# Patient Record
Sex: Female | Born: 1945 | Race: White | Hispanic: No | Marital: Married | Smoking: Former smoker
Health system: Southern US, Community
[De-identification: ages and names within clinical notes are randomized; demographics above are authoritative.]

## PROBLEM LIST (undated history)

## (undated) DIAGNOSIS — I10 Essential (primary) hypertension: Secondary | ICD-10-CM

## (undated) DIAGNOSIS — M1990 Unspecified osteoarthritis, unspecified site: Secondary | ICD-10-CM

## (undated) DIAGNOSIS — F419 Anxiety disorder, unspecified: Secondary | ICD-10-CM

## (undated) DIAGNOSIS — Z87448 Personal history of other diseases of urinary system: Secondary | ICD-10-CM

## (undated) DIAGNOSIS — M109 Gout, unspecified: Secondary | ICD-10-CM

## (undated) DIAGNOSIS — T8859XA Other complications of anesthesia, initial encounter: Secondary | ICD-10-CM

## (undated) DIAGNOSIS — T4145XA Adverse effect of unspecified anesthetic, initial encounter: Secondary | ICD-10-CM

## (undated) DIAGNOSIS — E785 Hyperlipidemia, unspecified: Secondary | ICD-10-CM

## (undated) DIAGNOSIS — E669 Obesity, unspecified: Secondary | ICD-10-CM

## (undated) HISTORY — DX: Gout, unspecified: M10.9

## (undated) HISTORY — DX: Obesity, unspecified: E66.9

---

## 1988-01-18 HISTORY — PX: ABDOMINAL HYSTERECTOMY: SHX81

## 2007-09-19 ENCOUNTER — Ambulatory Visit: Payer: Self-pay | Admitting: Specialist

## 2008-01-18 HISTORY — PX: CHOLECYSTECTOMY: SHX55

## 2008-01-31 ENCOUNTER — Emergency Department: Payer: Self-pay | Admitting: Emergency Medicine

## 2009-01-19 ENCOUNTER — Emergency Department: Payer: Self-pay | Admitting: Internal Medicine

## 2010-04-15 ENCOUNTER — Other Ambulatory Visit: Payer: Self-pay | Admitting: Orthopedic Surgery

## 2010-04-15 ENCOUNTER — Encounter (HOSPITAL_COMMUNITY): Payer: Medicare Other

## 2010-04-15 ENCOUNTER — Other Ambulatory Visit (HOSPITAL_COMMUNITY): Payer: Self-pay | Admitting: Orthopedic Surgery

## 2010-04-15 ENCOUNTER — Ambulatory Visit (HOSPITAL_COMMUNITY)
Admission: RE | Admit: 2010-04-15 | Discharge: 2010-04-15 | Disposition: A | Discharge status: Home / Self Care | Payer: Medicare Other | Source: Ambulatory Visit | Attending: Orthopedic Surgery | Admitting: Orthopedic Surgery

## 2010-04-15 DIAGNOSIS — Z01811 Encounter for preprocedural respiratory examination: Secondary | ICD-10-CM | POA: Insufficient documentation

## 2010-04-15 DIAGNOSIS — Z01818 Encounter for other preprocedural examination: Secondary | ICD-10-CM | POA: Insufficient documentation

## 2010-04-15 DIAGNOSIS — Z01812 Encounter for preprocedural laboratory examination: Secondary | ICD-10-CM | POA: Insufficient documentation

## 2010-04-15 LAB — URINALYSIS, ROUTINE W REFLEX MICROSCOPIC
Nitrite: NEGATIVE
Specific Gravity, Urine: 1.019 (ref 1.005–1.030)
Urobilinogen, UA: 0.2 mg/dL (ref 0.0–1.0)

## 2010-04-15 LAB — CBC
HCT: 31.6 % — ABNORMAL LOW (ref 36.0–46.0)
Hemoglobin: 10 g/dL — ABNORMAL LOW (ref 12.0–15.0)
MCV: 97.5 fL (ref 78.0–100.0)
WBC: 7.3 10*3/uL (ref 4.0–10.5)

## 2010-04-15 LAB — COMPREHENSIVE METABOLIC PANEL
ALT: 15 U/L (ref 0–35)
AST: 14 U/L (ref 0–37)
Albumin: 3.6 g/dL (ref 3.5–5.2)
Calcium: 9.2 mg/dL (ref 8.4–10.5)
GFR calc Af Amer: 38 mL/min — ABNORMAL LOW (ref >60)
Glucose, Bld: 96 mg/dL (ref 70–99)
Sodium: 139 mEq/L (ref 135–145)
Total Protein: 7.4 g/dL (ref 6.0–8.3)

## 2010-04-15 LAB — PROTIME-INR: INR: 1.01 (ref 0.00–1.49)

## 2010-04-15 LAB — APTT: aPTT: 26 seconds (ref 24–37)

## 2010-04-15 LAB — SURGICAL PCR SCREEN: Staphylococcus aureus: POSITIVE — AB

## 2010-04-26 ENCOUNTER — Inpatient Hospital Stay (HOSPITAL_COMMUNITY)
Admission: RE | Admit: 2010-04-26 | Discharge: 2010-04-29 | DRG: 470 | Disposition: A | Discharge status: Skilled Nursing Facility | Payer: Medicare Other | Source: Ambulatory Visit | Attending: Orthopedic Surgery | Admitting: Orthopedic Surgery

## 2010-04-26 DIAGNOSIS — D62 Acute posthemorrhagic anemia: Secondary | ICD-10-CM | POA: Diagnosis not present

## 2010-04-26 DIAGNOSIS — E8779 Other fluid overload: Secondary | ICD-10-CM | POA: Diagnosis not present

## 2010-04-26 DIAGNOSIS — R4182 Altered mental status, unspecified: Secondary | ICD-10-CM | POA: Diagnosis not present

## 2010-04-26 DIAGNOSIS — M1710 Unilateral primary osteoarthritis, unspecified knee: Principal | ICD-10-CM | POA: Diagnosis present

## 2010-04-26 DIAGNOSIS — E7800 Pure hypercholesterolemia, unspecified: Secondary | ICD-10-CM | POA: Diagnosis present

## 2010-04-26 DIAGNOSIS — I10 Essential (primary) hypertension: Secondary | ICD-10-CM | POA: Diagnosis present

## 2010-04-26 DIAGNOSIS — N289 Disorder of kidney and ureter, unspecified: Secondary | ICD-10-CM | POA: Diagnosis present

## 2010-04-27 LAB — CBC
Hemoglobin: 8.7 g/dL — ABNORMAL LOW (ref 12.0–15.0)
MCH: 31.6 pg (ref 26.0–34.0)
MCV: 99.6 fL (ref 78.0–100.0)
RBC: 2.75 MIL/uL — ABNORMAL LOW (ref 3.87–5.11)

## 2010-04-27 LAB — BASIC METABOLIC PANEL
CO2: 26 mEq/L (ref 19–32)
Chloride: 107 mEq/L (ref 96–112)
Creatinine, Ser: 1.43 mg/dL — ABNORMAL HIGH (ref 0.4–1.2)
GFR calc Af Amer: 44 mL/min — ABNORMAL LOW (ref >60)

## 2010-04-28 LAB — CBC
HCT: 24.2 % — ABNORMAL LOW (ref 36.0–46.0)
MCH: 31.6 pg (ref 26.0–34.0)
MCHC: 33.1 g/dL (ref 30.0–36.0)
MCV: 95.7 fL (ref 78.0–100.0)
RDW: 13.6 % (ref 11.5–15.5)

## 2010-04-28 LAB — BASIC METABOLIC PANEL
BUN: 21 mg/dL (ref 6–23)
CO2: 27 mEq/L (ref 19–32)
Chloride: 103 mEq/L (ref 96–112)
Creatinine, Ser: 1.5 mg/dL — ABNORMAL HIGH (ref 0.4–1.2)

## 2010-04-28 NOTE — H&P (Signed)
Carolyn Swanson, Carolyn Swanson                ACCOUNT NO.:  [id number]  MEDICAL RECORD NO.:  [id number]           PATIENT TYPE:  O  LOCATION:  XRAY                         FACILITY:  [facility]  PHYSICIAN:  Rozell Searing, [facility]    DATE OF BIRTH:  [birth year]  DATE OF ADMISSION:  04/15/2010 DATE OF DISCHARGE:  04/15/2010                             HISTORY & PHYSICAL   CHIEF COMPLAINT:  Left knee pain.  BRIEF HISTORY:  Ms. Aguado was initially seen by Dr. Lequita Halt back in September 2011 regarding her left knee.  She had cortisone injection at that time that helped for a short period of time.  Unfortunately the knee pain has come back and is worse than it was previously.  She is having worsening dysfunction.  She states that it is certainly limiting what she is able to do.  She is getting pain at nighttime as well as the daytime.  She now presents for left total knee arthroplasty.  Ms. Gutterman has been cleared by her primary care physician, Dr. Shayne Alken, with the only request that she continue her blood pressure medications.  ALLERGIES:  She has no medication allergies.  CURRENT MEDICATIONS INCLUDE: 1. Lisinopril/hydrochlorothiazide. 2. Folic acid. 3. Aspirin. 4. Carvedilol. 5. Lipitor. 6. Effexor. 7. She will discontinue the folic acid and aspirin 5 days prior to     surgery.  PAST MEDICAL HISTORY:  Includes end-stage arthritis of the left knee, history of shingles 40+ years ago, hypertension, hyperlipidemia, gallbladder disease.  PAST SURGICAL HISTORY:  Cholecystectomy and hysterectomy.  FAMILY HISTORY:  Father passed at age of 47, he had multiple health problems including lung cancer.  Mother passed at the age of 38 of breast cancer.  SOCIAL HISTORY:  The patient is married.  She is retired.  She denies past or present use of alcohol or tobacco products.  She has two children.  She plans to go to [employer] which is within [employer]	[place] over in [place].  She  has already spoken with their facility.  REVIEW OF SYSTEMS:  GENERAL:  Negative for fevers, chills or weight change.  HEENT:  Negative for headache, blurred vision or double vision. NEUROLOGIC:  Negative for rash or lesion.  RESPIRATORY:  Negative shortness of breath at rest or on exertion.  Last chest x-ray was a year ago.  CARDIOVASCULAR:  Negative for chest pain or palpitations.  Last EKG was about a year ago.  GI: Negative for nausea, vomiting or diarrhea.  GU: Negative for hematuria, dysuria.  MUSCULOSKELETAL: Positive for joint pain and swelling.  PHYSICAL EXAMINATION:  VITAL SIGNS:  Pulse 80, respirations 18, blood pressure 146/82 in the left arm.  GENERAL:  Ms. Suhre is alert and oriented x3.  She is well-developed, well-nourished in no apparent distress.  She is her stated height of 5 feet 7 inches and her stated weight of 203 pounds.  She is accompanied today by her friend.  HEENT: Normocephalic, atraumatic.  Extraocular movements intact.  NECK: Supple.  Full range of motion without lymphadenopathy.  CHEST:  Lungs are clear to auscultation bilaterally without wheezes, rhonchi or rales. HEART:  Regular rate and rhythm without murmur.  S1, S2 is appreciated. ABDOMEN: Bowel sounds present in all four quadrants.  Abdomen is soft, nontender to palpation.  EXTREMITIES:  Evaluation of the left knee negative for effusion.  Range is 5 to 120 degrees.  There is marked crepitus throughout the range of motion, tenderness with palpation over the medial greater than lateral joint line.  No instability is noted. NEUROLOGIC:  Sensation is grossly intact in the lower extremities bilaterally.  SKIN:  Unremarkable.  Radiographs AP and lateral views of the left knee reveal bone-on-bone arthritis of the medial and patellofemoral compartments.  IMPRESSION:  End-stage arthritis of the left knee.  PLAN:  Left total knee arthroplasty to be performed by Dr. Lequita Halt.  Dictated For:  Gus Rankin.  Aluisio, MD.     Rozell Searing, [facility]     LD/MEDQ  D:  04/26/2010  T:  04/26/2010  Job:  [id number]  Electronically Signed by Rozell Searing  on 04/28/2010 08:18:56 AM

## 2010-04-29 ENCOUNTER — Encounter: Payer: Self-pay | Admitting: Internal Medicine

## 2010-04-29 LAB — BASIC METABOLIC PANEL
CO2: 27 mEq/L (ref 19–32)
Calcium: 8.7 mg/dL (ref 8.4–10.5)
Chloride: 104 mEq/L (ref 96–112)
Creatinine, Ser: 1.3 mg/dL — ABNORMAL HIGH (ref 0.4–1.2)
Glucose, Bld: 143 mg/dL — ABNORMAL HIGH (ref 70–99)
Sodium: 137 mEq/L (ref 135–145)

## 2010-04-29 LAB — CBC
MCH: 31.6 pg (ref 26.0–34.0)
MCV: 93.9 fL (ref 78.0–100.0)
Platelets: 200 10*3/uL (ref 150–400)
RDW: 13.5 % (ref 11.5–15.5)

## 2010-04-30 LAB — CROSSMATCH
ABO/RH(D): O POS
Antibody Screen: NEGATIVE
Unit division: 0

## 2010-04-30 NOTE — Op Note (Signed)
Carolyn Swanson, Carolyn Swanson                ACCOUNT NO.:  [id number]  MEDICAL RECORD NO.:  [id number]           PATIENT TYPE:  I  LOCATION:  0004                         FACILITY:  [facility]  PHYSICIAN:  Ollen Gross, M.D.    DATE OF BIRTH:  Aug 06, 1945  DATE OF PROCEDURE: DATE OF DISCHARGE:                              OPERATIVE REPORT   PREOPERATIVE DIAGNOSIS:  Osteoarthritis of the left knee.  POSTOPERATIVE DIAGNOSIS:  Osteoarthritis of the left knee.  PROCEDURE:  Left total-knee arthroplasty.  SURGEON:  Ollen Gross, M.D.  ASSISTANT:  Alexzandrew L. Perkins, P.A.C.  ANESTHESIA:  Spinal.  ESTIMATED BLOOD LOSS:  Minimal.  DRAINS:  Hemovac x1.  TOURNIQUET TIME:  33 minutes at 300 mmHg.  COMPLICATIONS:  None.  CONDITION:  Stable to recovery.  BRIEF CLINICAL NOTE:  [name] is a 65 year old female who has advanced end-stage arthritis of the left worse than right knee with progressively worsening pain and dysfunction.  She has failed nonoperative management and presents now for a left total-knee arthroplasty.  PROCEDURE IN DETAIL:  After successful administration of spinal anesthetic, a tourniquet was placed high on her left thigh and her left lower extremity was prepped and draped in the usual sterile fashion. Extremities wrapped in Esmarch, knee flexed, tourniquet inflated to 300 mmHg.  Midline incision was made with a 10 blade through the subcutaneous tissue to the level of the extensor mechanism.  A fresh blade was used to make a medial parapatellar arthrotomy.  Soft tissue on the proximal medial tibia was subperiosteally elevated to the joint line with the knife and into the semimembranosus bursa with the Cobb elevator.  Soft tissue laterally was elevated with attention being paid to avoiding the patellar tendon on the tibial tubercle.  The patella was everted, knee flexed 90 degrees and ACL and PCL were removed.  The drill was used to create a starting hole in the  distal femur and the canal was thoroughly irrigated.  The 5-degrees left valgus alignment guide was placed and distal femoral cutting block was pinned to remove 11 mm off the distal femur.  Distal femoral resection was made with an oscillating saw.  The tibia was subluxed forward and the menisci were removed. Extramedullary tibial alignment guide was placed referencing proximally at the medial aspect of the tibial tubercle and distally along the second metatarsal axis of the tibial crest.  Block was pinned to remove 2 mm off the more deficient medial side.  Tibial resection was made with an oscillating saw.  Size 2.5 was the most appropriate tibial component and the proximal tibia was prepared with the modular drill and keel punch for the size 2.5.  Femoral sizing guide was placed, size 2.5 was the most appropriate. Rotation was marked off the epicondylar axis and confirmed by creating a rectangular flexion gap at 90 degrees.  Cutting block was pinned in this rotation and the anterior, posterior and chamfer cuts were made.  The intercondylar block was placed and that cut was made.  Trial size 2.5 posterior stabilized femur was placed.  The 10-mm posterior stabilized rotating platform insert trial was placed and  there was a tiny bit of hyperextension with the 12.5, which allowed for full extension with excellent varus-valgus and anterior-posterior balance throughout full range of motion.  Patella was everted and thickness measured to be 22 mm.  Freehand resection was taken to 13 mm, 35 template was placed, lug holes were drilled, trial patella was placed and it tracks normally. Osteophytes were removed off the posterior femur with the trial in place.  All trials were removed and the cut bone surfaces were prepared with pulsatile lavage.  Cement was mixed and once ready for implantation, the size 2.5 mobile bearing tibial tray, 2.5 posterior stabilized femur and 35 patella were cemented  into place and the patella was held with a clamp.  Trial 12.5-mm insert was placed, knee held in full extension and all extruded cement was removed.  When the cement was fully hardened, then the permanent 12.5-mm posterior stabilized rotating platform insert was placed into the tibial tray.  The wound was copiously irrigated with saline solution and the arthrotomy closed over a Hemovac drain with interrupted #1 PDS.  Flexion against gravity was 135 degrees and the patella tracks normally.  Tourniquet was released for a total time of 33 minutes.  Subcutaneous was closed with interrupted 2-0 Vicryl and subcuticular running 4-0 Monocryl.  Incision was cleaned and dried and Steri-Strips and a bulky sterile dressing were applied.  She was then placed into a knee immobilizer, awakened and transported to recovery in stable condition.     Ollen Gross, M.D.     FA/MEDQ  D:  04/26/2010  T:  04/26/2010  Job:  [id number]  Electronically Signed by Ollen Gross M.D. on 04/30/2010 09:01:53 AM

## 2010-04-30 NOTE — Discharge Summary (Signed)
Carolyn Swanson, Carolyn Swanson                ACCOUNT NO.:  [id number]  MEDICAL RECORD NO.:  [id number]           PATIENT TYPE:  I  LOCATION:  1617                         FACILITY:  [facility]  PHYSICIAN:  Ollen Gross, M.D.    DATE OF BIRTH:  02/28/1945  DATE OF ADMISSION:  04/26/2010 DATE OF DISCHARGE:                        DISCHARGE SUMMARY - REFERRING   TENTATIVE DATE OF DISCHARGE:  April 29, 2010.  ADMITTING DIAGNOSES: 1. Osteoarthritis, left knee. 2. History of shingles. 3. Hypertension. 4. Hyperlipidemia. 5. Mild renal insufficiency noted on preoperative labs.  DISCHARGE DIAGNOSES: 1. Osteoarthritis, left knee, status post left total knee replacement     arthroplasty. 2. Postop acute blood loss anemia. 3. Postoperative altered mental status, improved. 4. Mild renal insufficiency noted on preoperative labs.  PROCEDURE:  April 26, 2010, left total knee.  SURGEON:  Ollen Gross, M.D.  ASSISTANT:  Rozell Searing, [facility] ANESTHESIA:  Spinal.  TOURNIQUET TIME:  33 minutes.  CONSULTS:  None.  BRIEF HISTORY:  Carolyn Swanson is a 65 year old female, seen by Dr. Lequita Halt for ongoing knee pain, which has been progressively getting worse.  It was felt that she is willing to undergo surgical intervention, subsequently admitted for knee replacement.  LABORATORY DATA:  Preop CBC showed a low hemoglobin of 10, hematocrit of 31.6 with preexisting anemia.  Preop white count 7.3, platelets 278,000. PT/INR 13.5 and 1.01 with PTT of 26.  Chem panel on admission showed some mild renal insufficiency with creatinine of 1.62 and BUN of 43. Alkaline phosphatase was elevated at 139.  Remaining Chem panel all within normal limits.  Preop UA negative.  Blood group type O positive. Nasal swabs were positive for Staph aureus but negative for MRSA. Serial CBCs were followed.  Hemoglobin dropped down from 8.7 to 8 last night.  H and H was 7.7 and 22.9.  She was asymptomatic at this point. Would recheck the  hemoglobin at facility.  Serial prothrombin time is followed per Coumadin protocol.  PT/INR with a prothrombin time of 26.5 and the INR was therapeutic at 2.43.  Serial BMETs were followed for 3 days.  Electrolytes all remained within normal limits.  BUN came down from preop level of 43 down to 19, may be associated with mild dehydration due to n.p.o. status.  Creatinine was slightly elevated preop at 1.62, came down to 1.43, last noted improved level of 1.30.  X-rays, 2-view chest, April 15, 2010 negative.  EKG April 15, 2010, normal sinus rhythm, low-voltage QRS, no old tracing compared, nonspecific T-wave abnormality, confirmed by Dr. Sharyn Lull.  HOSPITAL COURSE:  The patient was admitted to [facility], taken to OR, underwent the above-stated procedure, tolerated procedure well, later transferred to recovery room on orthopedic floor.  Had a rough night following the surgery with pain, requiring p.o. and IV analgesics.  The patient did want to look into [facility] up in [place], so we got Social Work involved right after surgery, had decent urinary output.  Blood pressure meds were restarted on parameters.  She was moderately volume overloaded on first days, so we gave her a little bit of Lasix to help  pull out the spacing fluid. She had excellent urinary output after that.  She got up and did a little pivoting and had unfortunately some confusion, disorientation later that afternoon which was through the night and we switched her medications around.  We took her off the heavy narcotics, put her on mild pain medication and by the postoperative day #2 was actually improved and noted on postop day #2 also and the narcotics were adjusted.  She did improve throughout the afternoon of day #2 and then day #3.  Her creatinine was noted be a little elevated on postop day #2 to 1.5 but it did come back down on postop day #3 and was back down to 1.3.  She was slow to  progress.  She was seen on rounds on morning of day #3.  The altered mental status postop was transient, improved.  The family had been in touch with [facility] in [place] preoperatively and the family was under the impression that the bed would be available today.  We have not heard from the Social Work at the time of dictation but the patient was stable.  Even though her hemoglobin was low, she was asymptomatic with this.  We would set up and arrange for her to go today if the bed is confirmed to be available.  DISCHARGE PLAN: 1. Tentative transfer today on April 29, 2010. 2. Discharge diagnoses, please see above. 3. Discharge medications, current medications at time of dictation.     Coumadin protocol, please titrate the Coumadin level for target INR     between 2.0 and 3.0.  She needs to be on Coumadin for 3 weeks from     the date of surgery.  Effexor XR 75 p.o. q.h.s., aspirin 81 mg p.o.     daily, Coreg 12.5 mg twice a day, Lipitor 40 mg p.o. q.h.s.,     lisinopril/HCTZ 10/12.5 mg p.o. q.a.m., Colace 100 mg p.o. b.i.d.,     Nu-Iron 150 mg p.o. b.i.d. for 3 weeks, then discontinue the Nu-    Iron, Tylenol 325 mg 1 or 2 every 4 to 6 hours as needed for mild     pain, temperature or headache, laxative of choice, enema of choice,     Robaxin 500 mg p.o. q.6-8h. p.r.n. spasm, Ultracet 1 or 2 tablets     every 6 hours as needed for moderate pain.  DIET:  Heart-healthy diet.  ACTIVITY:  She is weightbearing as tolerated.  Total knee protocol, left lower extremity.  PT and OT, begin training, ambulation, ADLs.  Please note the patient may start showering once she is transferred over, however, do not submerge incision underwater, daily dressing changes associated with the showering.  FOLLOWUP:  She needs to follow up with Dr. Lequita Halt in the office in approximately 2 to 2-1/2 weeks following her date of surgery.  Please contact the office at [phone] to help arrange  appointment, transportation, and followup care for an appointment in about 2 to 2-1/2 weeks with Dr. Lequita Halt.  DISPOSITION:  The patient is trying to arrange transportation to [facility] in [place].  CONDITION ON DISCHARGE:  Pending notification of bed but at the time of dictation, the patient is improving.     Alexzandrew L. Julien Girt, P.A.C.   ______________________________ Ollen Gross, M.D.    ALP/MEDQ  D:  04/29/2010  T:  04/29/2010  Job:  [id number]  cc:   Dr. Randa Lynn  Electronically Signed by Marlowe Sax PERKINS P.A.C. on 04/29/2010 11:12:48 AM  Electronically Signed by Ollen Gross M.D. on 04/30/2010 09:01:55 AM

## 2010-05-17 ENCOUNTER — Encounter: Payer: Self-pay | Admitting: Orthopedic Surgery

## 2010-05-18 ENCOUNTER — Encounter: Payer: Self-pay | Admitting: Orthopedic Surgery

## 2010-05-18 ENCOUNTER — Encounter: Payer: Self-pay | Admitting: Internal Medicine

## 2010-06-18 ENCOUNTER — Encounter: Payer: Self-pay | Admitting: Internal Medicine

## 2010-06-18 ENCOUNTER — Encounter: Payer: Self-pay | Admitting: Orthopedic Surgery

## 2010-07-18 ENCOUNTER — Encounter: Payer: Self-pay | Admitting: Orthopedic Surgery

## 2010-07-18 ENCOUNTER — Encounter: Payer: Self-pay | Admitting: Internal Medicine

## 2010-08-18 ENCOUNTER — Encounter: Payer: Self-pay | Admitting: Internal Medicine

## 2011-01-18 HISTORY — PX: JOINT REPLACEMENT: SHX530

## 2012-11-12 ENCOUNTER — Ambulatory Visit: Payer: Self-pay | Admitting: Adult Health

## 2013-01-14 ENCOUNTER — Other Ambulatory Visit: Payer: Self-pay | Admitting: Orthopedic Surgery

## 2013-01-17 DIAGNOSIS — M109 Gout, unspecified: Secondary | ICD-10-CM

## 2013-01-17 HISTORY — DX: Gout, unspecified: M10.9

## 2013-01-23 ENCOUNTER — Encounter (HOSPITAL_COMMUNITY): Payer: Self-pay | Admitting: Pharmacy Technician

## 2013-01-25 ENCOUNTER — Other Ambulatory Visit (HOSPITAL_COMMUNITY): Payer: Self-pay | Admitting: *Deleted

## 2013-01-29 ENCOUNTER — Ambulatory Visit (HOSPITAL_COMMUNITY)
Admission: RE | Admit: 2013-01-29 | Discharge: 2013-01-29 | Disposition: A | Discharge status: Home / Self Care | Payer: Medicare Other | Source: Ambulatory Visit | Attending: Orthopedic Surgery | Admitting: Orthopedic Surgery

## 2013-01-29 ENCOUNTER — Encounter (HOSPITAL_COMMUNITY)
Admission: RE | Admit: 2013-01-29 | Discharge: 2013-01-29 | Disposition: A | Discharge status: Home / Self Care | Payer: Medicare Other | Source: Ambulatory Visit | Attending: Orthopedic Surgery | Admitting: Orthopedic Surgery

## 2013-01-29 ENCOUNTER — Encounter (HOSPITAL_COMMUNITY): Payer: Self-pay

## 2013-01-29 DIAGNOSIS — Z01818 Encounter for other preprocedural examination: Secondary | ICD-10-CM | POA: Insufficient documentation

## 2013-01-29 DIAGNOSIS — Z01812 Encounter for preprocedural laboratory examination: Secondary | ICD-10-CM | POA: Insufficient documentation

## 2013-01-29 DIAGNOSIS — Z0183 Encounter for blood typing: Secondary | ICD-10-CM | POA: Insufficient documentation

## 2013-01-29 DIAGNOSIS — M1710 Unilateral primary osteoarthritis, unspecified knee: Secondary | ICD-10-CM | POA: Insufficient documentation

## 2013-01-29 HISTORY — DX: Essential (primary) hypertension: I10

## 2013-01-29 HISTORY — DX: Adverse effect of unspecified anesthetic, initial encounter: T41.45XA

## 2013-01-29 HISTORY — DX: Anxiety disorder, unspecified: F41.9

## 2013-01-29 HISTORY — DX: Other complications of anesthesia, initial encounter: T88.59XA

## 2013-01-29 HISTORY — DX: Personal history of other diseases of urinary system: Z87.448

## 2013-01-29 HISTORY — DX: Hyperlipidemia, unspecified: E78.5

## 2013-01-29 HISTORY — DX: Unspecified osteoarthritis, unspecified site: M19.90

## 2013-01-29 LAB — COMPREHENSIVE METABOLIC PANEL
ALBUMIN: 3.6 g/dL (ref 3.5–5.2)
ALK PHOS: 181 U/L — AB (ref 39–117)
ALT: 16 U/L (ref 0–35)
AST: 17 U/L (ref 0–37)
BUN: 22 mg/dL (ref 6–23)
CALCIUM: 9.4 mg/dL (ref 8.4–10.5)
CO2: 24 mEq/L (ref 19–32)
CREATININE: 1.38 mg/dL — AB (ref 0.50–1.10)
Chloride: 103 mEq/L (ref 96–112)
GFR calc non Af Amer: 38 mL/min — ABNORMAL LOW (ref >90)
GFR, EST AFRICAN AMERICAN: 44 mL/min — AB (ref >90)
GLUCOSE: 141 mg/dL — AB (ref 70–99)
POTASSIUM: 5.2 meq/L (ref 3.7–5.3)
Sodium: 139 mEq/L (ref 137–147)
TOTAL PROTEIN: 7.8 g/dL (ref 6.0–8.3)
Total Bilirubin: 0.4 mg/dL (ref 0.3–1.2)

## 2013-01-29 LAB — SURGICAL PCR SCREEN
MRSA, PCR: NEGATIVE
STAPHYLOCOCCUS AUREUS: POSITIVE — AB

## 2013-01-29 LAB — CBC
HEMATOCRIT: 34.3 % — AB (ref 36.0–46.0)
HEMOGLOBIN: 11.2 g/dL — AB (ref 12.0–15.0)
MCH: 31 pg (ref 26.0–34.0)
MCHC: 32.7 g/dL (ref 30.0–36.0)
MCV: 95 fL (ref 78.0–100.0)
Platelets: 285 10*3/uL (ref 150–400)
RBC: 3.61 MIL/uL — ABNORMAL LOW (ref 3.87–5.11)
RDW: 13.4 % (ref 11.5–15.5)
WBC: 7.6 10*3/uL (ref 4.0–10.5)

## 2013-01-29 LAB — URINALYSIS, ROUTINE W REFLEX MICROSCOPIC
BILIRUBIN URINE: NEGATIVE
Glucose, UA: NEGATIVE mg/dL
HGB URINE DIPSTICK: NEGATIVE
KETONES UR: NEGATIVE mg/dL
Leukocytes, UA: NEGATIVE
NITRITE: NEGATIVE
Protein, ur: NEGATIVE mg/dL
Specific Gravity, Urine: 1.017 (ref 1.005–1.030)
UROBILINOGEN UA: 0.2 mg/dL (ref 0.0–1.0)
pH: 5.5 (ref 5.0–8.0)

## 2013-01-29 LAB — PROTIME-INR
INR: 1.02 (ref 0.00–1.49)
PROTHROMBIN TIME: 13.2 s (ref 11.6–15.2)

## 2013-01-29 LAB — APTT: APTT: 25 s (ref 24–37)

## 2013-01-29 NOTE — Progress Notes (Signed)
Abnormal CMET faxed to Dr. Wynelle Link

## 2013-01-29 NOTE — Progress Notes (Signed)
01/29/13 1200  OBSTRUCTIVE SLEEP APNEA  Have you ever been diagnosed with sleep apnea through a sleep study? No  Do you snore loudly (loud enough to be heard through closed doors)?  1  Do you often feel tired, fatigued, or sleepy during the daytime? 1  Has anyone observed you stop breathing during your sleep? 0  Do you have, or are you being treated for high blood pressure? 1  BMI more than 35 kg/m2? 0  Age over 69 years old? 1  Neck circumference greater than 40 cm/18 inches? 0  Gender: 0  Obstructive Sleep Apnea Score 4  Score 4 or greater  Results sent to PCP

## 2013-01-29 NOTE — Patient Instructions (Signed)
Carolyn Swanson  01/29/2013                           YOUR PROCEDURE IS SCHEDULED ON: 02/04/13               PLEASE REPORT TO SHORT STAY CENTER AT : 6:00 AM               CALL THIS NUMBER IF ANY PROBLEMS THE DAY OF SURGERY :               [phone]                      REMEMBER:   Do not eat food or drink liquids AFTER MIDNIGHT   Take these medicines the morning of surgery with A SIP OF WATER: CARVEDILOL   Do not wear jewelry, make-up   Do not wear lotions, powders, or perfumes.   Do not shave legs or underarms 12 hrs. before surgery (men may shave face)  Do not bring valuables to the hospital.  Contacts, dentures or bridgework may not be worn into surgery.  Leave suitcase in the car. After surgery it may be brought to your room.  For patients admitted to the hospital more than one night, checkout time is 11:00                          The day of discharge.   Patients discharged the day of surgery will not be allowed to drive home                             If going home same day of surgery, must have someone stay with you first                           24 hrs at home and arrange for some one to drive you home from hospital.    Special Instructions:   Please read over the following fact sheets that you were given:               1. MRSA  INFORMATION                      2. [place]               3.  [place]                                                X_____________________________________________________________________        Failure to follow these instructions may result in cancellation of your surgery

## 2013-02-03 ENCOUNTER — Other Ambulatory Visit: Payer: Self-pay | Admitting: Orthopedic Surgery

## 2013-02-03 NOTE — H&P (Signed)
Carolyn Swanson  DOB: 04/23/1945 Married / Language: English / Race: White Female  Date of Admission:  02-04-2013  Chief Complaint:  Right Knee Pain  History of Present Illness The patient is a 67 year old female who comes in for a preoperative History and Physical. The patient is scheduled for a right total knee arthroplasty to be performed by Dr. Frank V. Aluisio, MD at [facility] on 02-04-2013. The patient is a 67 year old female who presents for follow up of their knee. The patient is being followed for their right knee pain and osteoarthritis. They are now week(s) out from cortisone injection. Symptoms reported today include: pain, swelling, grinding, giving way and difficulty ambulating. The patient feels that they are doing poorly and report their pain level to be moderate to severe. The following medication has been used for pain control: antiinflammatory medication (ibuprofen prn). The patient has not gotten any relief of their symptoms with Cortisone injections (she states it may have helped for about a week). The patient is very pleased with the left knee. She has had an excellent result with that. The right knee is getting progressively worse. It is almost as bad as the left knee was prior to when it was fixed. She is now had cortisone and has attempted exercise program, but the right knee is not improving. Visco supplements never helped the left one. She is ready to proceed with surgical treatment and get the right knee replaced. They have been treated conservatively in the past for the above stated problem and despite conservative measures, they continue to have progressive pain and severe functional limitations and dysfunction. They have failed non-operative management including home exercise, medications, and injections. It is felt that they would benefit from undergoing total joint replacement. Risks and benefits of the procedure have been discussed with the  patient and they elect to proceed with surgery. There are no active contraindications to surgery such as ongoing infection or rapidly progressive neurological disease.   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 mother Cerebrovascular Accident. Father. father    Social History Drug/Alcohol Rehab (Previously). no Number of flights of stairs before winded. 4-5 Marital status. married Current work status. retired Children. 2 Illicit drug use. no Tobacco use. Never smoker. never smoker Living situation. live with spouse Alcohol use. never consumed alcohol Exercise. Exercises weekly; does running / walking Drug/Alcohol Rehab (Currently). no Tobacco / smoke exposure. no Pain Contract. no    Medication History Ibuprofen ( Oral) Specific dose unknown - Active. Aspirin EC (81MG Tablet DR, Oral) Active. Carvedilol (12.5MG Tablet, Oral) Active. Lipitor (10MG Tablet, Oral) Active. Venlafaxine HCl (75MG Capsule ER 24HR, Oral) Active. Lisinopril (10MG Tablet, Oral) Active.   Past Surgical History Gallbladder Surgery. laporoscopic Breast Biopsy. left Hysterectomy. Date: 1990. complete (non-cancerous) Total Knee Replacement. left   Past Medical History Menopause Hypertension Shingles    Review of Systems General:Not Present- Chills, Fever, Night Sweats, Fatigue, Weight Gain, Weight Loss and Memory Loss. Skin:Not Present- Hives, Itching, Rash, Eczema and Lesions. HEENT:Not Present- Tinnitus, Headache, Double Vision, Visual Loss, Hearing Loss and Dentures. Respiratory:Not Present- Shortness of breath with exertion, Shortness of breath at rest, Allergies, Coughing up blood and Chronic Cough. Cardiovascular:Not Present- Chest Pain, Racing/skipping heartbeats, Difficulty Breathing Lying Down, Murmur, Swelling and  Palpitations. Gastrointestinal:Not Present- Bloody Stool, Heartburn, Abdominal Pain, Vomiting, Nausea, Constipation, Diarrhea, Difficulty Swallowing, Jaundice and   Loss of appetitie. Female Genitourinary:Not Present- Blood in Urine, Urinary frequency, Weak urinary stream, Discharge, Flank Pain, Incontinence, Painful Urination, Urgency, Urinary Retention and Urinating at Night. Musculoskeletal:Present- Joint Pain. Not Present- Muscle Weakness, Muscle Pain, Joint Swelling, Back Pain, Morning Stiffness and Spasms. Neurological:Not Present- Tremor, Dizziness, Blackout spells, Paralysis, Difficulty with balance and Weakness. Psychiatric:Not Present- Insomnia.    Vitals Weight: 205 lb Height: 67 in Body Surface Area: 2.1 m Body Mass Index: 32.11 kg/m Pulse: 96 (Regular) Resp.: 16 (Unlabored) BP: 150/64 (Sitting, Right Arm, Standard)   Physical Exam The physical exam findings are as follows:   General Mental Status - Alert, cooperative and good historian. General Appearance- pleasant. Not in acute distress. Orientation- Oriented X3. Build & Nutrition- Well nourished and Well developed.   Head and Neck Head- normocephalic, atraumatic . Neck Global Assessment- supple. no bruit auscultated on the right and no bruit auscultated on the left.   Eye Pupil- Bilateral- Regular and Round. Motion- Bilateral- EOMI.   Chest and Lung Exam Auscultation: Breath sounds:- clear at anterior chest wall and - clear at posterior chest wall. Adventitious sounds:- No Adventitious sounds.   Cardiovascular Auscultation:Rhythm- Regular rate and rhythm. Heart Sounds- S1 WNL and S2 WNL. Murmurs & Other Heart Sounds:Auscultation of the heart reveals - No Murmurs.   Abdomen Inspection:Contour- Generalized mild distention. Palpation/Percussion:Tenderness- Abdomen is non-tender to palpation. Rigidity (guarding)- Abdomen is soft. Auscultation:Auscultation of  the abdomen reveals - Bowel sounds normal.   Female Genitourinary Not done, not pertinent to present illness  Musculoskeletal On exam well developed female alert and oriented in no apparent distress. Her left knee looks fantastic. Absolutely no swelling about the left knee. Range of motion is about 0 to 125 on the left. No tenderness or instability. Right knee marked crepitus on range of motion. Range 5 to 120. Tender medial greater than lateral. No instability noted.  RADIOGRAPHS: AP both knees and lateral from her last visit shows that she now has bone on bone arthritis in the medial and patellofemoral compartments of the right knee. Her left knee prosthesis is in excellent position with no periprosthetic abnormalities.   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 Wynelle Link.  Plan is to go to [facility] in [place], [place]  PCP - Dr. Baldemar Lenis - Patient has been seen preoperatively and felt to be stable for surgery.  The patient does not have any contraindications and will receive TXA (tranexamic acid) prior to surgery.  Signed electronically by Joelene Millin, III PA-C

## 2013-02-04 ENCOUNTER — Encounter (HOSPITAL_COMMUNITY): Payer: Self-pay | Admitting: *Deleted

## 2013-02-04 ENCOUNTER — Inpatient Hospital Stay (HOSPITAL_COMMUNITY): Payer: Medicare Other | Admitting: Anesthesiology

## 2013-02-04 ENCOUNTER — Encounter (HOSPITAL_COMMUNITY)
Admission: RE | Disposition: A | Discharge status: Skilled Nursing Facility | Payer: Self-pay | Source: Ambulatory Visit | Attending: Orthopedic Surgery

## 2013-02-04 ENCOUNTER — Inpatient Hospital Stay (HOSPITAL_COMMUNITY)
Admission: RE | Admit: 2013-02-04 | Discharge: 2013-02-07 | DRG: 470 | Disposition: A | Discharge status: Skilled Nursing Facility | Payer: Medicare Other | Source: Ambulatory Visit | Attending: Orthopedic Surgery | Admitting: Orthopedic Surgery

## 2013-02-04 ENCOUNTER — Encounter (HOSPITAL_COMMUNITY): Payer: Medicare Other | Admitting: Anesthesiology

## 2013-02-04 DIAGNOSIS — M1710 Unilateral primary osteoarthritis, unspecified knee: Secondary | ICD-10-CM | POA: Diagnosis present

## 2013-02-04 DIAGNOSIS — E785 Hyperlipidemia, unspecified: Secondary | ICD-10-CM | POA: Diagnosis present

## 2013-02-04 DIAGNOSIS — M179 Osteoarthritis of knee, unspecified: Secondary | ICD-10-CM

## 2013-02-04 DIAGNOSIS — I10 Essential (primary) hypertension: Secondary | ICD-10-CM | POA: Diagnosis present

## 2013-02-04 DIAGNOSIS — Z96651 Presence of right artificial knee joint: Secondary | ICD-10-CM

## 2013-02-04 DIAGNOSIS — D62 Acute posthemorrhagic anemia: Secondary | ICD-10-CM | POA: Diagnosis not present

## 2013-02-04 DIAGNOSIS — Z6834 Body mass index (BMI) 34.0-34.9, adult: Secondary | ICD-10-CM

## 2013-02-04 HISTORY — PX: TOTAL KNEE ARTHROPLASTY: SHX125

## 2013-02-04 LAB — TYPE AND SCREEN
ABO/RH(D): O POS
ANTIBODY SCREEN: NEGATIVE

## 2013-02-04 SURGERY — ARTHROPLASTY, KNEE, TOTAL
Anesthesia: Spinal | Site: Knee | Laterality: Right

## 2013-02-04 MED ORDER — DIPHENHYDRAMINE HCL 12.5 MG/5ML PO ELIX: 12.5000 mg | ORAL_SOLUTION | ORAL | Status: DC | PRN

## 2013-02-04 MED ORDER — MORPHINE SULFATE 2 MG/ML IJ SOLN: 1.0000 mg | INTRAMUSCULAR | Status: DC | PRN

## 2013-02-04 MED ORDER — MIDAZOLAM HCL 2 MG/2ML IJ SOLN
INTRAMUSCULAR | Status: AC
  Filled 2013-02-04: qty 2

## 2013-02-04 MED ORDER — RIVAROXABAN 10 MG PO TABS
10.0000 mg | ORAL_TABLET | Freq: Every day | ORAL | Status: DC
  Administered 2013-02-05 – 2013-02-07 (×3): 10 mg via ORAL
  Filled 2013-02-04 (×4): qty 1

## 2013-02-04 MED ORDER — PHENYLEPHRINE HCL 10 MG/ML IJ SOLN
INTRAMUSCULAR | Status: AC
  Filled 2013-02-04: qty 1

## 2013-02-04 MED ORDER — BUPIVACAINE HCL 0.25 % IJ SOLN
INTRAMUSCULAR | Status: DC | PRN
  Administered 2013-02-04: 30 mL

## 2013-02-04 MED ORDER — FENTANYL CITRATE 0.05 MG/ML IJ SOLN
INTRAMUSCULAR | Status: AC
  Filled 2013-02-04: qty 2

## 2013-02-04 MED ORDER — DEXAMETHASONE 6 MG PO TABS
10.0000 mg | ORAL_TABLET | Freq: Every day | ORAL | Status: AC
  Administered 2013-02-05: 10 mg via ORAL
  Filled 2013-02-04: qty 1

## 2013-02-04 MED ORDER — OXYCODONE HCL 5 MG PO TABS
5.0000 mg | ORAL_TABLET | ORAL | Status: DC | PRN
  Administered 2013-02-04 (×2): 5 mg via ORAL
  Administered 2013-02-04 – 2013-02-07 (×16): 10 mg via ORAL
  Filled 2013-02-04 (×3): qty 2
  Filled 2013-02-04: qty 1
  Filled 2013-02-04 (×2): qty 2
  Filled 2013-02-04: qty 1
  Filled 2013-02-04 (×11): qty 2

## 2013-02-04 MED ORDER — DEXAMETHASONE SODIUM PHOSPHATE 10 MG/ML IJ SOLN
10.0000 mg | Freq: Every day | INTRAMUSCULAR | Status: AC
  Filled 2013-02-04: qty 1

## 2013-02-04 MED ORDER — PROPOFOL 10 MG/ML IV BOLUS
INTRAVENOUS | Status: AC
  Filled 2013-02-04: qty 20

## 2013-02-04 MED ORDER — ATORVASTATIN CALCIUM 40 MG PO TABS
40.0000 mg | ORAL_TABLET | Freq: Every day | ORAL | Status: DC
  Administered 2013-02-04 – 2013-02-06 (×3): 40 mg via ORAL
  Filled 2013-02-04 (×4): qty 1

## 2013-02-04 MED ORDER — SODIUM CHLORIDE 0.9 % IR SOLN
Status: DC | PRN
  Administered 2013-02-04: 1000 mL

## 2013-02-04 MED ORDER — CEFAZOLIN SODIUM-DEXTROSE 2-3 GM-% IV SOLR
2.0000 g | INTRAVENOUS | Status: AC
  Administered 2013-02-04: 2 g via INTRAVENOUS

## 2013-02-04 MED ORDER — CHLORHEXIDINE GLUCONATE 4 % EX LIQD: 60.0000 mL | Freq: Once | CUTANEOUS | Status: DC

## 2013-02-04 MED ORDER — POLYETHYLENE GLYCOL 3350 17 G PO PACK: 17.0000 g | PACK | Freq: Every day | ORAL | Status: DC | PRN

## 2013-02-04 MED ORDER — EPHEDRINE SULFATE 50 MG/ML IJ SOLN
INTRAMUSCULAR | Status: AC
  Filled 2013-02-04: qty 1

## 2013-02-04 MED ORDER — FENTANYL CITRATE 0.05 MG/ML IJ SOLN: 25.0000 ug | INTRAMUSCULAR | Status: DC | PRN

## 2013-02-04 MED ORDER — PHENYLEPHRINE 40 MCG/ML (10ML) SYRINGE FOR IV PUSH (FOR BLOOD PRESSURE SUPPORT)
PREFILLED_SYRINGE | INTRAVENOUS | Status: AC
  Filled 2013-02-04: qty 10

## 2013-02-04 MED ORDER — DEXAMETHASONE SODIUM PHOSPHATE 10 MG/ML IJ SOLN
INTRAMUSCULAR | Status: AC
  Filled 2013-02-04: qty 1

## 2013-02-04 MED ORDER — SODIUM CHLORIDE 0.9 % IJ SOLN
INTRAMUSCULAR | Status: DC | PRN
  Administered 2013-02-04: 09:00:00

## 2013-02-04 MED ORDER — BUPIVACAINE IN DEXTROSE 0.75-8.25 % IT SOLN
INTRATHECAL | Status: DC | PRN
  Administered 2013-02-04: 1.8 mL via INTRATHECAL

## 2013-02-04 MED ORDER — PHENYLEPHRINE HCL 10 MG/ML IJ SOLN: 30.0000 ug/min | INTRAVENOUS | Status: DC

## 2013-02-04 MED ORDER — BUPIVACAINE HCL (PF) 0.25 % IJ SOLN
INTRAMUSCULAR | Status: AC
  Filled 2013-02-04: qty 30

## 2013-02-04 MED ORDER — FENTANYL CITRATE 0.05 MG/ML IJ SOLN
INTRAMUSCULAR | Status: DC | PRN
  Administered 2013-02-04: 50 ug via INTRAVENOUS

## 2013-02-04 MED ORDER — HYDROMORPHONE HCL PF 1 MG/ML IJ SOLN
0.5000 mg | INTRAMUSCULAR | Status: DC | PRN
  Administered 2013-02-05: 0.5 mg via INTRAVENOUS
  Filled 2013-02-04: qty 1

## 2013-02-04 MED ORDER — PHENYLEPHRINE HCL 10 MG/ML IJ SOLN
10.0000 mg | INTRAVENOUS | Status: DC | PRN
  Administered 2013-02-04: 50 ug/min via INTRAVENOUS

## 2013-02-04 MED ORDER — MIDAZOLAM HCL 5 MG/5ML IJ SOLN
INTRAMUSCULAR | Status: DC | PRN
  Administered 2013-02-04: 2 mg via INTRAVENOUS

## 2013-02-04 MED ORDER — LACTATED RINGERS IV SOLN
INTRAVENOUS | Status: DC | PRN
  Administered 2013-02-04 (×3): via INTRAVENOUS

## 2013-02-04 MED ORDER — METOCLOPRAMIDE HCL 5 MG/ML IJ SOLN: 5.0000 mg | Freq: Three times a day (TID) | INTRAMUSCULAR | Status: DC | PRN

## 2013-02-04 MED ORDER — ONDANSETRON HCL 4 MG/2ML IJ SOLN
INTRAMUSCULAR | Status: AC
  Filled 2013-02-04: qty 2

## 2013-02-04 MED ORDER — SODIUM CHLORIDE 0.9 % IV SOLN: INTRAVENOUS | Status: DC

## 2013-02-04 MED ORDER — ONDANSETRON HCL 4 MG/2ML IJ SOLN: 4.0000 mg | Freq: Four times a day (QID) | INTRAMUSCULAR | Status: DC | PRN

## 2013-02-04 MED ORDER — ONDANSETRON HCL 4 MG PO TABS: 4.0000 mg | ORAL_TABLET | Freq: Four times a day (QID) | ORAL | Status: DC | PRN

## 2013-02-04 MED ORDER — BISACODYL 10 MG RE SUPP
10.0000 mg | Freq: Every day | RECTAL | Status: DC | PRN
Start: 2013-02-04 — End: 2013-02-07

## 2013-02-04 MED ORDER — 0.9 % SODIUM CHLORIDE (POUR BTL) OPTIME
TOPICAL | Status: DC | PRN
  Administered 2013-02-04: 1000 mL

## 2013-02-04 MED ORDER — BUPIVACAINE LIPOSOME 1.3 % IJ SUSP
20.0000 mL | Freq: Once | INTRAMUSCULAR | Status: DC
  Filled 2013-02-04: qty 20

## 2013-02-04 MED ORDER — VENLAFAXINE HCL ER 75 MG PO CP24
75.0000 mg | ORAL_CAPSULE | Freq: Every day | ORAL | Status: DC
  Administered 2013-02-04 – 2013-02-06 (×3): 75 mg via ORAL
  Filled 2013-02-04 (×4): qty 1

## 2013-02-04 MED ORDER — ACETAMINOPHEN 10 MG/ML IV SOLN
INTRAVENOUS | Status: DC | PRN
  Administered 2013-02-04: 1000 mg via INTRAVENOUS

## 2013-02-04 MED ORDER — ONDANSETRON HCL 4 MG/2ML IJ SOLN
INTRAMUSCULAR | Status: DC | PRN
  Administered 2013-02-04: 4 mg via INTRAVENOUS

## 2013-02-04 MED ORDER — FLEET ENEMA 7-19 GM/118ML RE ENEM: 1.0000 | ENEMA | Freq: Once | RECTAL | Status: AC | PRN

## 2013-02-04 MED ORDER — METHOCARBAMOL 500 MG PO TABS
500.0000 mg | ORAL_TABLET | Freq: Four times a day (QID) | ORAL | Status: DC | PRN
  Administered 2013-02-04 – 2013-02-07 (×6): 500 mg via ORAL
  Filled 2013-02-04 (×6): qty 1

## 2013-02-04 MED ORDER — ACETAMINOPHEN 500 MG PO TABS
1000.0000 mg | ORAL_TABLET | Freq: Four times a day (QID) | ORAL | Status: AC
  Administered 2013-02-04 – 2013-02-05 (×4): 1000 mg via ORAL
  Filled 2013-02-04 (×4): qty 2

## 2013-02-04 MED ORDER — CEFAZOLIN SODIUM-DEXTROSE 2-3 GM-% IV SOLR
INTRAVENOUS | Status: AC
  Filled 2013-02-04: qty 50

## 2013-02-04 MED ORDER — DEXAMETHASONE SODIUM PHOSPHATE 10 MG/ML IJ SOLN: 10.0000 mg | Freq: Once | INTRAMUSCULAR | Status: DC

## 2013-02-04 MED ORDER — MEPERIDINE HCL 50 MG/ML IJ SOLN: 6.2500 mg | INTRAMUSCULAR | Status: DC | PRN

## 2013-02-04 MED ORDER — SODIUM CHLORIDE 0.9 % IJ SOLN
INTRAMUSCULAR | Status: AC
  Filled 2013-02-04: qty 10

## 2013-02-04 MED ORDER — TRAMADOL HCL 50 MG PO TABS: 50.0000 mg | ORAL_TABLET | Freq: Four times a day (QID) | ORAL | Status: DC | PRN

## 2013-02-04 MED ORDER — SODIUM CHLORIDE 0.9 % IV SOLN
INTRAVENOUS | Status: DC
  Administered 2013-02-04 – 2013-02-05 (×2): via INTRAVENOUS

## 2013-02-04 MED ORDER — ACETAMINOPHEN 325 MG PO TABS: 650.0000 mg | ORAL_TABLET | Freq: Four times a day (QID) | ORAL | Status: DC | PRN

## 2013-02-04 MED ORDER — SODIUM CHLORIDE 0.9 % IJ SOLN
INTRAMUSCULAR | Status: AC
  Filled 2013-02-04: qty 50

## 2013-02-04 MED ORDER — CEFAZOLIN SODIUM-DEXTROSE 2-3 GM-% IV SOLR
2.0000 g | Freq: Four times a day (QID) | INTRAVENOUS | Status: AC
  Administered 2013-02-04 (×2): 2 g via INTRAVENOUS
  Filled 2013-02-04 (×2): qty 50

## 2013-02-04 MED ORDER — METHOCARBAMOL 100 MG/ML IJ SOLN
500.0000 mg | Freq: Four times a day (QID) | INTRAVENOUS | Status: DC | PRN
  Filled 2013-02-04: qty 5

## 2013-02-04 MED ORDER — PHENOL 1.4 % MT LIQD
1.0000 | OROMUCOSAL | Status: DC | PRN
Start: 2013-02-04 — End: 2013-02-07
  Filled 2013-02-04: qty 177

## 2013-02-04 MED ORDER — EPHEDRINE SULFATE 50 MG/ML IJ SOLN
INTRAMUSCULAR | Status: DC | PRN
  Administered 2013-02-04 (×3): 10 mg via INTRAVENOUS

## 2013-02-04 MED ORDER — TRANEXAMIC ACID 100 MG/ML IV SOLN
1000.0000 mg | INTRAVENOUS | Status: AC
  Administered 2013-02-04: 1000 mg via INTRAVENOUS
  Filled 2013-02-04: qty 10

## 2013-02-04 MED ORDER — KETOROLAC TROMETHAMINE 15 MG/ML IJ SOLN: 7.5000 mg | Freq: Four times a day (QID) | INTRAMUSCULAR | Status: AC | PRN

## 2013-02-04 MED ORDER — ACETAMINOPHEN 10 MG/ML IV SOLN
1000.0000 mg | INTRAVENOUS | Status: DC
  Filled 2013-02-04: qty 100

## 2013-02-04 MED ORDER — METOCLOPRAMIDE HCL 10 MG PO TABS: 5.0000 mg | ORAL_TABLET | Freq: Three times a day (TID) | ORAL | Status: DC | PRN

## 2013-02-04 MED ORDER — LACTATED RINGERS IV SOLN
INTRAVENOUS | Status: DC
  Administered 2013-02-04: 11:00:00 via INTRAVENOUS

## 2013-02-04 MED ORDER — MUPIROCIN 2 % EX OINT
TOPICAL_OINTMENT | Freq: Two times a day (BID) | CUTANEOUS | Status: DC
  Administered 2013-02-04 – 2013-02-05 (×2): via NASAL
  Administered 2013-02-05: 1 via NASAL
  Administered 2013-02-06 – 2013-02-07 (×3): via NASAL
  Filled 2013-02-04: qty 22

## 2013-02-04 MED ORDER — CARVEDILOL 12.5 MG PO TABS
12.5000 mg | ORAL_TABLET | Freq: Two times a day (BID) | ORAL | Status: DC
  Administered 2013-02-04 – 2013-02-07 (×6): 12.5 mg via ORAL
  Filled 2013-02-04 (×8): qty 1

## 2013-02-04 MED ORDER — DOCUSATE SODIUM 100 MG PO CAPS
100.0000 mg | ORAL_CAPSULE | Freq: Two times a day (BID) | ORAL | Status: DC
  Administered 2013-02-04 – 2013-02-07 (×6): 100 mg via ORAL

## 2013-02-04 MED ORDER — MENTHOL 3 MG MT LOZG
1.0000 | LOZENGE | OROMUCOSAL | Status: DC | PRN
  Filled 2013-02-04: qty 9

## 2013-02-04 MED ORDER — PHENYLEPHRINE HCL 10 MG/ML IJ SOLN
INTRAMUSCULAR | Status: DC | PRN
  Administered 2013-02-04 (×2): 40 ug via INTRAVENOUS

## 2013-02-04 MED ORDER — ACETAMINOPHEN 650 MG RE SUPP: 650.0000 mg | Freq: Four times a day (QID) | RECTAL | Status: DC | PRN

## 2013-02-04 MED ORDER — PROPOFOL INFUSION 10 MG/ML OPTIME
INTRAVENOUS | Status: DC | PRN
  Administered 2013-02-04: 50 ug/kg/min via INTRAVENOUS

## 2013-02-04 MED ORDER — DEXAMETHASONE SODIUM PHOSPHATE 10 MG/ML IJ SOLN
INTRAMUSCULAR | Status: DC | PRN
  Administered 2013-02-04: 10 mg via INTRAVENOUS

## 2013-02-04 MED ORDER — PROPOFOL 10 MG/ML IV BOLUS
INTRAVENOUS | Status: DC | PRN
  Administered 2013-02-04: 20 mg via INTRAVENOUS

## 2013-02-04 MED ORDER — PROMETHAZINE HCL 25 MG/ML IJ SOLN: 6.2500 mg | INTRAMUSCULAR | Status: DC | PRN

## 2013-02-04 SURGICAL SUPPLY — 56 items
BAG ZIPLOCK 12X15 (MISCELLANEOUS) ×3 IMPLANT
BANDAGE ELASTIC 6 VELCRO ST LF (GAUZE/BANDAGES/DRESSINGS) ×3 IMPLANT
BANDAGE ESMARK 6X9 LF (GAUZE/BANDAGES/DRESSINGS) ×1 IMPLANT
BLADE SAG 18X100X1.27 (BLADE) ×3 IMPLANT
BLADE SAW SGTL 11.0X1.19X90.0M (BLADE) ×3 IMPLANT
BNDG ESMARK 6X9 LF (GAUZE/BANDAGES/DRESSINGS) ×3
BOWL SMART MIX CTS (DISPOSABLE) ×3 IMPLANT
CAPT RP KNEE ×3 IMPLANT
CEMENT HV SMART SET (Cement) ×6 IMPLANT
CLOSURE WOUND 1/2 X4 (GAUZE/BANDAGES/DRESSINGS) ×1
CUFF TOURN SGL QUICK 34 (TOURNIQUET CUFF) ×2
CUFF TRNQT CYL 34X4X40X1 (TOURNIQUET CUFF) ×1 IMPLANT
DECANTER SPIKE VIAL GLASS SM (MISCELLANEOUS) ×3 IMPLANT
DRAPE EXTREMITY T 121X128X90 (DRAPE) ×3 IMPLANT
DRAPE POUCH INSTRU U-SHP 10X18 (DRAPES) ×3 IMPLANT
DRAPE U-SHAPE 47X51 STRL (DRAPES) ×3 IMPLANT
DRSG ADAPTIC 3X8 NADH LF (GAUZE/BANDAGES/DRESSINGS) ×3 IMPLANT
DURAPREP 26ML APPLICATOR (WOUND CARE) ×3 IMPLANT
ELECT REM PT RETURN 9FT ADLT (ELECTROSURGICAL) ×3
ELECTRODE REM PT RTRN 9FT ADLT (ELECTROSURGICAL) ×1 IMPLANT
EVACUATOR 1/8 PVC DRAIN (DRAIN) ×3 IMPLANT
FACESHIELD LNG OPTICON STERILE (SAFETY) ×15 IMPLANT
GLOVE BIO SURGEON STRL SZ7.5 (GLOVE) IMPLANT
GLOVE BIO SURGEON STRL SZ8 (GLOVE) ×3 IMPLANT
GLOVE BIOGEL PI IND STRL 8 (GLOVE) ×2 IMPLANT
GLOVE BIOGEL PI INDICATOR 8 (GLOVE) ×4
GLOVE SURG SS PI 6.5 STRL IVOR (GLOVE) IMPLANT
GOWN STRL REUS W/TWL LRG LVL3 (GOWN DISPOSABLE) ×3 IMPLANT
GOWN STRL REUS W/TWL XL LVL3 (GOWN DISPOSABLE) IMPLANT
HANDPIECE INTERPULSE COAX TIP (DISPOSABLE) ×2
IMMOBILIZER KNEE 20 (SOFTGOODS) ×3 IMPLANT
KIT BASIN OR (CUSTOM PROCEDURE TRAY) ×3 IMPLANT
MANIFOLD NEPTUNE II (INSTRUMENTS) ×3 IMPLANT
NDL SAFETY ECLIPSE 18X1.5 (NEEDLE) ×2 IMPLANT
NEEDLE HYPO 18GX1.5 SHARP (NEEDLE) ×4
NS IRRIG 1000ML POUR BTL (IV SOLUTION) ×3 IMPLANT
PACK TOTAL JOINT (CUSTOM PROCEDURE TRAY) ×3 IMPLANT
PAD ABD 8X10 STRL (GAUZE/BANDAGES/DRESSINGS) ×3 IMPLANT
PADDING CAST ABS 6INX4YD NS (CAST SUPPLIES) ×2
PADDING CAST ABS COTTON 6X4 NS (CAST SUPPLIES) ×1 IMPLANT
PADDING CAST COTTON 6X4 STRL (CAST SUPPLIES) ×9 IMPLANT
POSITIONER SURGICAL ARM (MISCELLANEOUS) ×3 IMPLANT
SET HNDPC FAN SPRY TIP SCT (DISPOSABLE) ×1 IMPLANT
SPONGE GAUZE 4X4 12PLY (GAUZE/BANDAGES/DRESSINGS) ×3 IMPLANT
STRIP CLOSURE SKIN 1/2X4 (GAUZE/BANDAGES/DRESSINGS) ×2 IMPLANT
SUCTION FRAZIER 12FR DISP (SUCTIONS) ×3 IMPLANT
SUT MNCRL AB 4-0 PS2 18 (SUTURE) ×3 IMPLANT
SUT VIC AB 2-0 CT1 27 (SUTURE) ×6
SUT VIC AB 2-0 CT1 TAPERPNT 27 (SUTURE) ×3 IMPLANT
SUT VLOC 180 0 24IN GS25 (SUTURE) ×3 IMPLANT
SYR 20CC LL (SYRINGE) ×3 IMPLANT
SYR 50ML LL SCALE MARK (SYRINGE) ×3 IMPLANT
TOWEL OR 17X26 10 PK STRL BLUE (TOWEL DISPOSABLE) ×6 IMPLANT
TRAY FOLEY CATH 14FRSI W/METER (CATHETERS) ×3 IMPLANT
WATER STERILE IRR 1500ML POUR (IV SOLUTION) ×3 IMPLANT
WRAP KNEE MAXI GEL POST OP (GAUZE/BANDAGES/DRESSINGS) ×3 IMPLANT

## 2013-02-04 NOTE — Interval H&P Note (Signed)
History and Physical Interval Note:  02/04/2013 7:04 AM  Carolyn Swanson  has presented today for surgery, with the diagnosis of OSTEOARTHRITIS RIGHT KNEE  The various methods of treatment have been discussed with the patient and family. After consideration of risks, benefits and other options for treatment, the patient has consented to  Procedure(s): RIGHT TOTAL KNEE ARTHROPLASTY (Right) as a surgical intervention .  The patient's history has been reviewed, patient examined, no change in status, stable for surgery.  I have reviewed the patient's chart and labs.  Questions were answered to the patient's satisfaction.     Gearlean Alf

## 2013-02-04 NOTE — Progress Notes (Signed)
UR completed 

## 2013-02-04 NOTE — Evaluation (Signed)
Physical Therapy Evaluation Patient Details Name: Carolyn Swanson MRN: [id number] DOB: December 15, 1945 Today's Date: 02/04/2013 Time: 1245-8099 PT Time Calculation (min): 26 min  PT Assessment / Plan / Recommendation History of Present Illness  s/p right TKA  Clinical Impression  Pt will benefit form PT to address deficits below    PT Assessment  Patient needs continued PT services    Follow Up Recommendations  Home health PT    Does the patient have the potential to tolerate intense rehabilitation      Barriers to Discharge        Equipment Recommendations  None recommended by PT    Recommendations for Other Services     Frequency Min 3X/week    Precautions / Restrictions Precautions Required Braces or Orthoses: Knee Immobilizer - Right Knee Immobilizer - Right: Discontinue once straight leg raise with < 10 degree lag Restrictions Other Position/Activity Restrictions: WBAT   Pertinent Vitals/Pain Hurting, was premedicated, willing to do therapy      Mobility  Bed Mobility Overal bed mobility: Needs Assistance Bed Mobility: Supine to Sit Supine to sit: Min assist General bed mobility comments: cues for technique Transfers Overall transfer level: Needs assistance Equipment used: Rolling walker (2 wheeled) Transfers: Sit to/from Stand Sit to Stand: Min assist General transfer comment: cues for hand placement and RLE position Ambulation/Gait Ambulation/Gait assistance: Min assist;Min guard Ambulation Distance (Feet): 45 Feet Assistive device: Rolling walker (2 wheeled) Gait Pattern/deviations: Step-to pattern General Gait Details: cues for  technique    Exercises Total Joint Exercises Ankle Circles/Pumps: AROM;Both;10 reps Quad Sets: AROM;Both;10 reps   PT Diagnosis: Difficulty walking  PT Problem List: Decreased strength;Decreased range of motion;Decreased activity tolerance;Decreased mobility PT Treatment Interventions: DME instruction;Gait  training;Functional mobility training;Therapeutic activities;Therapeutic exercise;Patient/family education     PT Goals(Current goals can be found in the care plan section) Acute Rehab PT Goals Patient Stated Goal: I after rehab PT Goal Formulation: With patient Time For Goal Achievement: 02/11/13 Potential to Achieve Goals: Good  Visit Information  Last PT Received On: 02/04/13 Assistance Needed: +1 History of Present Illness: s/p right TKA       Prior Functioning  Home Living Family/patient expects to be discharged to:: Skilled nursing facility Living Arrangements: Spouse/significant other Additional Comments: prefers [facility] in [place] Prior Function Level of Independence: Independent Communication Communication: No difficulties    Cognition  Cognition Arousal/Alertness: Awake/alert Behavior During Therapy: WFL for tasks assessed/performed Overall Cognitive Status: Within Functional Limits for tasks assessed    Extremity/Trunk Assessment Upper Extremity Assessment Upper Extremity Assessment: Defer to OT evaluation Lower Extremity Assessment Lower Extremity Assessment: RLE deficits/detail RLE Deficits / Details: SLR with minimal assist;  ankle WFL    Balance    End of Session PT - End of Session Equipment Utilized During Treatment: Gait belt Activity Tolerance: Patient tolerated treatment well Patient left: in chair;with call bell/phone within reach;with family/visitor present CPM Right Knee CPM Right Knee: Off  GP     [facility] 02/04/2013, 4:37 PM

## 2013-02-04 NOTE — Op Note (Signed)
Pre-operative diagnosis- Osteoarthritis  Right knee(s)  Post-operative diagnosis- Osteoarthritis Right knee(s)  Procedure-  Right  Total Knee Arthroplasty  Surgeon- Dione Plover. Caiya Bettes, MD  Assistant- Arlee Muslim, PA-C   Anesthesia-  Spinal EBL-* No blood loss amount entered *  Drains Hemovac  Tourniquet time- 30 minutes @ 732 mm Hg Complications- None  Condition-PACU - hemodynamically stable.   Brief Clinical Note  Carolyn Swanson is a 68 y.o. year old female with end stage OA of her right knee with progressively worsening pain and dysfunction. She has constant pain, with activity and at rest and significant functional deficits with difficulties even with ADLs. She has had extensive non-op management including analgesics, injections of cortisone and viscosupplements, and home exercise program, but remains in significant pain with significant dysfunction.Radiographs show bone on bone arthritis medial and patellofemoral. She presents now for right Total Knee Arthroplasty.    Procedure in detail---   The patient is brought into the operating room and positioned supine on the operating table. After successful administration of  Spinal,   a tourniquet is placed high on the  Right thigh(s) and the lower extremity is prepped and draped in the usual sterile fashion. Time out is performed by the operating team and then the  Right lower extremity is wrapped in Esmarch, knee flexed and the tourniquet inflated to 300 mmHg.       A midline incision is made with a ten blade through the subcutaneous tissue to the level of the extensor mechanism. A fresh blade is used to make a medial parapatellar arthrotomy. Soft tissue over the proximal medial tibia is subperiosteally elevated to the joint line with a knife and into the semimembranosus bursa with a Cobb elevator. Soft tissue over the proximal lateral tibia is elevated with attention being paid to avoiding the patellar tendon on the tibial tubercle. The  patella is everted, knee flexed 90 degrees and the ACL and PCL are removed. Findings are bone on bone medial and patellofemoral with large medial osteophytes.        The drill is used to create a starting hole in the distal femur and the canal is thoroughly irrigated with sterile saline to remove the fatty contents. The 5 degree Right  valgus alignment guide is placed into the femoral canal and the distal femoral cutting block is pinned to remove 10 mm off the distal femur. Resection is made with an oscillating saw.      The tibia is subluxed forward and the menisci are removed. The extramedullary alignment guide is placed referencing proximally at the medial aspect of the tibial tubercle and distally along the second metatarsal axis and tibial crest. The block is pinned to remove 20mm off the more deficient medial  side. Resection is made with an oscillating saw. Size 3is the most appropriate size for the tibia and the proximal tibia is prepared with the modular drill and keel punch for that size.      The femoral sizing guide is placed and size 3 is most appropriate. Rotation is marked off the epicondylar axis and confirmed by creating a rectangular flexion gap at 90 degrees. The size 3 cutting block is pinned in this rotation and the anterior, posterior and chamfer cuts are made with the oscillating saw. The intercondylar block is then placed and that cut is made.      Trial size 3 tibial component, trial size 3 posterior stabilized femur and a 10  mm posterior stabilized rotating platform insert trial  is placed. Full extension is achieved with excellent varus/valgus and anterior/posterior balance throughout full range of motion. The patella is everted and thickness measured to be 21  mm. Free hand resection is taken to 12 mm, a 35 template is placed, lug holes are drilled, trial patella is placed, and it tracks normally. Osteophytes are removed off the posterior femur with the trial in place. All trials are  removed and the cut bone surfaces prepared with pulsatile lavage. Cement is mixed and once ready for implantation, the size 3 tibial implant, size  3 posterior stabilized femoral component, and the size 35 patella are cemented in place and the patella is held with the clamp. The trial insert is placed and the knee held in full extension. The Exparel (20 ml mixed with 30 ml saline) and .25% Bupivicaine, are injected into the extensor mechanism, posterior capsule, medial and lateral gutters and subcutaneous tissues.  All extruded cement is removed and once the cement is hard the permanent 10 mm posterior stabilized rotating platform insert is placed into the tibial tray.      The wound is copiously irrigated with saline solution and the extensor mechanism closed over a hemovac drain with #1 PDS suture. The tourniquet is released for a total tourniquet time of 30  minutes. Flexion against gravity is 140 degrees and the patella tracks normally. Subcutaneous tissue is closed with 2.0 vicryl and subcuticular with running 4.0 Monocryl. The incision is cleaned and dried and steri-strips and a bulky sterile dressing are applied. The limb is placed into a knee immobilizer and the patient is awakened and transported to recovery in stable condition.      Please note that a surgical assistant was a medical necessity for this procedure in order to perform it in a safe and expeditious manner. Surgical assistant was necessary to retract the ligaments and vital neurovascular structures to prevent injury to them and also necessary for proper positioning of the limb to allow for anatomic placement of the prosthesis.   Dione Plover Carolyn Bonneville, MD    02/04/2013, 9:12 AM

## 2013-02-04 NOTE — Anesthesia Preprocedure Evaluation (Signed)
Anesthesia Evaluation  Patient identified by MRN, date of birth, ID band Patient awake    Reviewed: Allergy & Precautions, H&P , NPO status , Patient's Chart, lab work & pertinent test results  History of Anesthesia Complications (+) Emergence Delirium  Airway Mallampati: II TM Distance: >3 FB Neck ROM: Full    Dental no notable dental hx.    Pulmonary neg pulmonary ROS,  breath sounds clear to auscultation  Pulmonary exam normal       Cardiovascular hypertension, Pt. on medications negative cardio ROS  Rhythm:Regular Rate:Normal     Neuro/Psych negative neurological ROS  negative psych ROS   GI/Hepatic negative GI ROS, Neg liver ROS,   Endo/Other  negative endocrine ROS  Renal/GU negative Renal ROS  negative genitourinary   Musculoskeletal negative musculoskeletal ROS (+)   Abdominal   Peds negative pediatric ROS (+)  Hematology negative hematology ROS (+)   Anesthesia Other Findings   Reproductive/Obstetrics negative OB ROS                           Anesthesia Physical Anesthesia Plan  ASA: II  Anesthesia Plan: Spinal   Post-op Pain Management:    Induction:   Airway Management Planned: Simple Face Mask  Additional Equipment:   Intra-op Plan:   Post-operative Plan:   Informed Consent: I have reviewed the patients History and Physical, chart, labs and discussed the procedure including the risks, benefits and alternatives for the proposed anesthesia with the patient or authorized representative who has indicated his/her understanding and acceptance.   Dental advisory given  Plan Discussed with: CRNA  Anesthesia Plan Comments:         Anesthesia Quick Evaluation

## 2013-02-04 NOTE — Anesthesia Postprocedure Evaluation (Signed)
  Anesthesia Post-op Note  Patient: Carolyn Swanson  Procedure(s) Performed: Procedure(s) (LRB): RIGHT TOTAL KNEE ARTHROPLASTY (Right)  Patient Location: PACU  Anesthesia Type: Spinal  Level of Consciousness: awake and alert   Airway and Oxygen Therapy: Patient Spontanous Breathing  Post-op Pain: mild  Post-op Assessment: Post-op Vital signs reviewed, Patient's Cardiovascular Status Stable, Respiratory Function Stable, Patent Airway and No signs of Nausea or vomiting  Last Vitals:  Filed Vitals:   02/04/13 1415  BP: 130/67  Pulse: 85  Temp: 36.6 C  Resp: 12    Post-op Vital Signs: stable   Complications: No apparent anesthesia complications

## 2013-02-04 NOTE — Transfer of Care (Signed)
Immediate Anesthesia Transfer of Care Note  Patient: Carolyn Swanson  Procedure(s) Performed: Procedure(s) (LRB): RIGHT TOTAL KNEE ARTHROPLASTY (Right)  Patient Location: PACU  Anesthesia Type: Spinal  Level of Consciousness: sedated, patient cooperative and responds to stimulation  Airway & Oxygen Therapy: Patient Spontanous Breathing and Patient connected to face mask oxgen  Post-op Assessment: Report given to PACU RN and Post -op Vital signs reviewed and stable  Post vital signs: Reviewed and stable, T-10 level on exam in PACU denied pain.  Complications: No apparent anesthesia complications

## 2013-02-04 NOTE — Plan of Care (Signed)
Problem: Consults Goal: Diagnosis- Total Joint Replacement Right total knee     

## 2013-02-04 NOTE — Anesthesia Procedure Notes (Signed)
Spinal  Patient location during procedure: OR Start time: 02/04/2013 8:10 AM End time: 02/04/2013 8:14 AM Staffing CRNA/Resident: Anne Fu Performed by: resident/CRNA  Preanesthetic Checklist Completed: patient identified, site marked, surgical consent, pre-op evaluation, timeout performed, IV checked, risks and benefits discussed and monitors and equipment checked Spinal Block Patient position: sitting Prep: Betadine Patient monitoring: heart rate, continuous pulse ox and blood pressure Approach: right paramedian Location: L2-3 Injection technique: single-shot Needle Needle type: Spinocan  Needle gauge: 22 G Needle length: 9 cm Assessment Sensory level: T4 Additional Notes Expiration date of kit checked and confirmed. Patient tolerated procedure well, without complications. X 1 attempt with noted clear CSF return easy aspiration and administration of medication.  Noted T4 level on exam prior to incision.

## 2013-02-04 NOTE — H&P (View-Only) (Signed)
Carolyn Swanson  DOB: 01/26/1945 Married / Language: English / Race: White Female  Date of Admission:  02-04-2013  Chief Complaint:  Right Knee Pain  History of Present Illness The patient is a 68 year old female who comes in for a preoperative History and Physical. The patient is scheduled for a right total knee arthroplasty to be performed by Dr. Dione Plover. Aluisio, MD at [facility] on 02-04-2013. The patient is a 68 year old female who presents for follow up of their knee. The patient is being followed for their right knee pain and osteoarthritis. They are now week(s) out from cortisone injection. Symptoms reported today include: pain, swelling, grinding, giving way and difficulty ambulating. The patient feels that they are doing poorly and report their pain level to be moderate to severe. The following medication has been used for pain control: antiinflammatory medication (ibuprofen prn). The patient has not gotten any relief of their symptoms with Cortisone injections (she states it may have helped for about a week). The patient is very pleased with the left knee. She has had an excellent result with that. The right knee is getting progressively worse. It is almost as bad as the left knee was prior to when it was fixed. She is now had cortisone and has attempted exercise program, but the right knee is not improving. Visco supplements never helped the left one. She is ready to proceed with surgical treatment and get the right knee replaced. They have been treated conservatively in the past for the above stated problem and despite conservative measures, they continue to have progressive pain and severe functional limitations and dysfunction. They have failed non-operative management including home exercise, medications, and injections. It is felt that they would benefit from undergoing total joint replacement. Risks and benefits of the procedure have been discussed with the  patient and they elect to proceed with surgery. There are no active contraindications to surgery such as ongoing infection or rapidly progressive neurological disease.   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 mother Cerebrovascular Accident. Father. father    Social History Drug/Alcohol Rehab (Previously). no Number of flights of stairs before winded. 4-5 Marital status. married Current work status. retired Careers information officer. 2 Illicit drug use. no Tobacco use. Never smoker. never smoker Living situation. live with spouse Alcohol use. never consumed alcohol Exercise. Exercises weekly; does running / walking Drug/Alcohol Rehab (Currently). no Tobacco / smoke exposure. no Pain Contract. no    Medication History Ibuprofen ( Oral) Specific dose unknown - Active. Aspirin EC (81MG  Tablet DR, Oral) Active. Carvedilol (12.5MG  Tablet, Oral) Active. Lipitor (10MG  Tablet, Oral) Active. Venlafaxine HCl (75MG  Capsule ER 24HR, Oral) Active. Lisinopril (10MG  Tablet, Oral) Active.   Past Surgical History Gallbladder Surgery. laporoscopic Breast Biopsy. left Hysterectomy. Date: 61. complete (non-cancerous) Total Knee Replacement. left   Past Medical History Menopause Hypertension Shingles    Review of Systems General:Not Present- Chills, Fever, Night Sweats, Fatigue, Weight Gain, Weight Loss and Memory Loss. Skin:Not Present- Hives, Itching, Rash, Eczema and Lesions. HEENT:Not Present- Tinnitus, Headache, Double Vision, Visual Loss, Hearing Loss and Dentures. Respiratory:Not Present- Shortness of breath with exertion, Shortness of breath at rest, Allergies, Coughing up blood and Chronic Cough. Cardiovascular:Not Present- Chest Pain, Racing/skipping heartbeats, Difficulty Breathing Lying Down, Murmur, Swelling and  Palpitations. Gastrointestinal:Not Present- Bloody Stool, Heartburn, Abdominal Pain, Vomiting, Nausea, Constipation, Diarrhea, Difficulty Swallowing, Jaundice and  Loss of appetitie. Female Genitourinary:Not Present- Blood in Urine, Urinary frequency, Weak urinary stream, Discharge, Flank Pain, Incontinence, Painful Urination, Urgency, Urinary Retention and Urinating at Night. Musculoskeletal:Present- Joint Pain. Not Present- Muscle Weakness, Muscle Pain, Joint Swelling, Back Pain, Morning Stiffness and Spasms. Neurological:Not Present- Tremor, Dizziness, Blackout spells, Paralysis, Difficulty with balance and Weakness. Psychiatric:Not Present- Insomnia.    Vitals Weight: 205 lb Height: 67 in Body Surface Area: 2.1 m Body Mass Index: 32.11 kg/m Pulse: 96 (Regular) Resp.: 16 (Unlabored) BP: 150/64 (Sitting, Right Arm, Standard)   Physical Exam The physical exam findings are as follows:   General Mental Status - Alert, cooperative and good historian. General Appearance- pleasant. Not in acute distress. Orientation- Oriented X3. Build & Nutrition- Well nourished and Well developed.   Head and Neck Head- normocephalic, atraumatic . Neck Global Assessment- supple. no bruit auscultated on the right and no bruit auscultated on the left.   Eye Pupil- Bilateral- Regular and Round. Motion- Bilateral- EOMI.   Chest and Lung Exam Auscultation: Breath sounds:- clear at anterior chest wall and - clear at posterior chest wall. Adventitious sounds:- No Adventitious sounds.   Cardiovascular Auscultation:Rhythm- Regular rate and rhythm. Heart Sounds- S1 WNL and S2 WNL. Murmurs & Other Heart Sounds:Auscultation of the heart reveals - No Murmurs.   Abdomen Inspection:Contour- Generalized mild distention. Palpation/Percussion:Tenderness- Abdomen is non-tender to palpation. Rigidity (guarding)- Abdomen is soft. Auscultation:Auscultation of  the abdomen reveals - Bowel sounds normal.   Female Genitourinary Not done, not pertinent to present illness  Musculoskeletal On exam well developed female alert and oriented in no apparent distress. Her left knee looks fantastic. Absolutely no swelling about the left knee. Range of motion is about 0 to 125 on the left. No tenderness or instability. Right knee marked crepitus on range of motion. Range 5 to 120. Tender medial greater than lateral. No instability noted.  RADIOGRAPHS: AP both knees and lateral from her last visit shows that she now has bone on bone arthritis in the medial and patellofemoral compartments of the right knee. Her left knee prosthesis is in excellent position with no periprosthetic abnormalities.   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 Wynelle Link.  Plan is to go to [facility] in [place], [place]  PCP - Dr. Baldemar Lenis - Patient has been seen preoperatively and felt to be stable for surgery.  The patient does not have any contraindications and will receive TXA (tranexamic acid) prior to surgery.  Signed electronically by Joelene Millin, III PA-C

## 2013-02-05 LAB — BASIC METABOLIC PANEL
BUN: 27 mg/dL — AB (ref 6–23)
CO2: 23 mEq/L (ref 19–32)
Calcium: 8.6 mg/dL (ref 8.4–10.5)
Chloride: 102 mEq/L (ref 96–112)
Creatinine, Ser: 1.48 mg/dL — ABNORMAL HIGH (ref 0.50–1.10)
GFR calc non Af Amer: 35 mL/min — ABNORMAL LOW (ref >90)
GFR, EST AFRICAN AMERICAN: 41 mL/min — AB (ref >90)
GLUCOSE: 175 mg/dL — AB (ref 70–99)
POTASSIUM: 5.3 meq/L (ref 3.7–5.3)
Sodium: 138 mEq/L (ref 137–147)

## 2013-02-05 LAB — CBC
HEMATOCRIT: 29 % — AB (ref 36.0–46.0)
HEMOGLOBIN: 9.6 g/dL — AB (ref 12.0–15.0)
MCH: 31.2 pg (ref 26.0–34.0)
MCHC: 33.1 g/dL (ref 30.0–36.0)
MCV: 94.2 fL (ref 78.0–100.0)
Platelets: 287 10*3/uL (ref 150–400)
RBC: 3.08 MIL/uL — ABNORMAL LOW (ref 3.87–5.11)
RDW: 13.1 % (ref 11.5–15.5)
WBC: 13.8 10*3/uL — AB (ref 4.0–10.5)

## 2013-02-05 NOTE — Progress Notes (Signed)
Clinical Social Work Department CLINICAL SOCIAL WORK PLACEMENT NOTE 02/05/2013  Patient:  Carolyn Swanson, Carolyn Swanson  Account Number:  [id number] Admit date:  02/04/2013  Clinical Social Worker:  Werner Lean, LCSW  Date/time:  02/05/2013 03:46 PM  Clinical Social Work is seeking post-discharge placement for this patient at the following level of care:   SKILLED NURSING   (*CSW will update this form in Epic as items are completed)     Patient/family provided with [place] Department of Clinical Social Work's list of facilities offering this level of care within the geographic area requested by the patient (or if unable, by the patient's family).  02/05/2013  Patient/family informed of their freedom to choose among providers that offer the needed level of care, that participate in Medicare, Medicaid or managed care program needed by the patient, have an available bed and are willing to accept the patient.    Patient/family informed of [facility]' ownership interest in [facility], as well as of the fact that they are under no obligation to receive care at this facility.  PASARR submitted to EDS on  PASARR number received from EDS on 04/29/2010  FL2 transmitted to all facilities in geographic area requested by pt/family on  02/05/2013 FL2 transmitted to all facilities within larger geographic area on   Patient informed that his/her managed care company has contracts with or will negotiate with  certain facilities, including the following:     Patient/family informed of bed offers received:  02/05/2013 Patient chooses bed at [facility] Physician recommends and patient chooses bed at    Patient to be transferred to [facility] on   Patient to be transferred to facility by   The following physician request were entered in Epic:   Additional Comments:  Werner Lean LCSW [phone]

## 2013-02-05 NOTE — Progress Notes (Signed)
   Subjective: 1 Day Post-Op Procedure(s) (LRB): RIGHT TOTAL KNEE ARTHROPLASTY (Right) Patient reports pain as mild.   Patient seen in rounds with Dr. Wynelle Link.  Family in room on morning rounds. Patient is well, but has had some minor complaints of pain in the knee, requiring pain medications We resume therapy today.  She walked about 26 yesterday. Plan is to go [facility] in [place], [place] after hospital stay.  Objective: Vital signs in last 24 hours: Temp:  [97.5 F (36.4 C)-98.3 F (36.8 C)] 98.3 F (36.8 C) (01/20 0520) Pulse Rate:  [66-85] 74 (01/20 0520) Resp:  [12-20] 20 (01/20 0520) BP: (92-146)/(48-85) 126/76 mmHg (01/20 0520) SpO2:  [92 %-100 %] 92 % (01/20 0520) Weight:  [99.338 kg (219 lb)] 99.338 kg (219 lb) (01/19 1115)  Intake/Output from previous day:  Intake/Output Summary (Last 24 hours) at 02/05/13 0815 Last data filed at 02/05/13 8101  Gross per 24 hour  Intake 4627.5 ml  Output   2480 ml  Net 2147.5 ml    Intake/Output this shift: UOP 1300 since MN +2147  Labs:  Recent Labs  02/05/13 0431  HGB 9.6*    Recent Labs  02/05/13 0431  WBC 13.8*  RBC 3.08*  HCT 29.0*  PLT 287    Recent Labs  02/05/13 0431  NA 138  K 5.3  CL 102  CO2 23  BUN 27*  CREATININE 1.48*  GLUCOSE 175*  CALCIUM 8.6   No results found for this basename: LABPT, INR,  in the last 72 hours  EXAM General - Patient is Alert, Appropriate and Oriented Extremity - Neurovascular intact Sensation intact distally Dorsiflexion/Plantar flexion intact Dressing - dressing C/D/I Motor Function - intact, moving foot and toes well on exam.  Hemovac pulled without difficulty.  Past Medical History  Diagnosis Date  . Complication of anesthesia     "I was crazy for about 4 days after surgery"  . Hypertension   . Hyperlipidemia   . Arthritis   . History of nephritis     as child - no residual problems  . Anxiety     Assessment/Plan: 1 Day Post-Op Procedure(s)  (LRB): RIGHT TOTAL KNEE ARTHROPLASTY (Right) Principal Problem:   OA (osteoarthritis) of knee  Estimated body mass index is 34.29 kg/(m^2) as calculated from the following:   Height as of this encounter: 5\' 7"  (1.702 m).   Weight as of this encounter: 99.338 kg (219 lb). Advance diet Up with therapy Discharge to SNF  DVT Prophylaxis - Xarelto Weight-Bearing as tolerated to right leg D/C O2 and Pulse OX and try on Room Air  Carolyn Swanson 02/05/2013, 8:15 AM

## 2013-02-05 NOTE — Evaluation (Signed)
Occupational Therapy Evaluation Patient Details Name: Carolyn Swanson MRN: [id number] DOB: 31-Dec-1945 Today's Date: 02/05/2013 Time: 7846-9629 and 904-907 OT Time Calculation (min): 31 min  OT Assessment / Plan / Recommendation History of present illness s/p right TKA   Clinical Impression   Pt was admitted for the above    OT Assessment  Patient needs continued OT Services    Follow Up Recommendations  SNF    Barriers to Discharge      Equipment Recommendations  None recommended by OT (has 3;1 and shower seat)    Recommendations for Other Services    Frequency  Min 2X/week    Precautions / Restrictions Precautions Precautions: Knee;Fall Precaution Comments: instructed pt on KI use for amb Required Braces or Orthoses: Knee Immobilizer - Right Restrictions Weight Bearing Restrictions: No Other Position/Activity Restrictions: WBAT   Pertinent Vitals/Pain Pt had high pain when I checked on her several times this am.  At time of eval, pain was decreased, but not rated.  She was sleepy from pain medicated.  Repositioned in bed:  PT was following me for exercise and RN brought more pain medication    ADL  Grooming: Wash/dry hands;Wash/dry face;Set up Where Assessed - Grooming: Unsupported sitting Upper Body Bathing: Set up Where Assessed - Upper Body Bathing: Unsupported sitting Lower Body Bathing: Moderate assistance Where Assessed - Lower Body Bathing: Supported sit to stand Upper Body Dressing: Minimal assistance Where Assessed - Upper Body Dressing: Unsupported sitting Lower Body Dressing: Maximal assistance Where Assessed - Lower Body Dressing: Supported sit to stand Toilet Transfer: Minimal assistance Toilet Transfer Method: Arts development officer: Therapist, occupational and Hygiene: Minimal assistance Where Assessed - Best boy and Hygiene: Sit to stand from 3-in-1 or toilet Equipment Used: Rolling  walker Transfers/Ambulation Related to ADLs: ambulate 5 feet back to bed with min A ADL Comments: performed adl from [facility].  Had accident in chair earlier when she sneezed    OT Diagnosis: Generalized weakness  OT Problem List: Decreased strength;Decreased activity tolerance;Decreased knowledge of use of DME or AE;Pain OT Treatment Interventions: Self-care/ADL training;Balance training;Patient/family education;DME and/or AE instruction   OT Goals(Current goals can be found in the care plan section) Acute Rehab OT Goals Patient Stated Goal: I after rehab OT Goal Formulation: With patient Time For Goal Achievement: 02/12/13 Potential to Achieve Goals: Good ADL Goals Pt Will Perform Grooming: with supervision;standing Pt Will Perform Lower Body Bathing: with supervision;with adaptive equipment;sit to/from stand Pt Will Transfer to Toilet: with supervision;ambulating;bedside commode Pt Will Perform Toileting - Clothing Manipulation and hygiene: with supervision;sit to/from stand  Visit Information  Last OT Received On: 02/05/13 Assistance Needed: +1 History of Present Illness: s/p right TKA       Prior Functioning     Home Living Family/patient expects to be discharged to:: Skilled nursing facility Prior Function Level of Independence: Independent Communication Communication: No difficulties         Vision/Perception     Cognition  Cognition Arousal/Alertness:  (drowsy but arousable) Behavior During Therapy: WFL for tasks assessed/performed Overall Cognitive Status:  (a little confused from pain meds)    Extremity/Trunk Assessment Upper Extremity Assessment Upper Extremity Assessment: Overall WFL for tasks assessed     Mobility Bed Mobility Overal bed mobility: Needs Assistance Bed Mobility: Supine to Sit Supine to sit: Min assist General bed mobility comments: cues for technique Transfers Overall transfer level: Needs assistance Equipment used: Rolling walker  (2 wheeled) Transfers: Sit to/from Stand  Sit to Stand: Min assist General transfer comment: cues for hand placement and RLE position     Exercise     Balance     End of Session OT - End of Session Activity Tolerance: Patient tolerated treatment well Patient left: in bed;with call bell/phone within reach;with family/visitor present CPM Right Knee CPM Right Knee: On  GO     Carolyn Swanson 02/05/2013, 3:13 PM Lesle Chris, OTR/L [phone] 02/05/2013

## 2013-02-05 NOTE — Progress Notes (Signed)
Physical Therapy Treatment Patient Details Name: Carolyn Swanson MRN: [id number] DOB: [birth year] Today's Date: 02/05/2013 Time: 6270-3500 PT Time Calculation (min): 29 min  PT Assessment / Plan / Recommendation  History of Present Illness s/p right TKA   PT Comments   POD # 1 am session.  Applied KI and instructed pt and daughter on [place] use for amb.  Assisted OOB to amb in hallway then positioned in recliner.     Follow Up Recommendations  Home health PT     Does the patient have the potential to tolerate intense rehabilitation     Barriers to Discharge        Equipment Recommendations  None recommended by PT    Recommendations for Other Services    Frequency 7X/week   Progress towards PT Goals Progress towards PT goals: Progressing toward goals  Plan      Precautions / Restrictions Precautions Precautions: Knee;Fall Precaution Comments: instructed pt on KI use for amb Required Braces or Orthoses: Knee Immobilizer - Right Restrictions Weight Bearing Restrictions: No Other Position/Activity Restrictions: WBAT    Pertinent Vitals/Pain C/o 8/10 pain Muscle relax requested ICE applied    Mobility  Bed Mobility Overal bed mobility: Needs Assistance Bed Mobility: Supine to Sit Supine to sit: Min assist General bed mobility comments: cues for technique Transfers Overall transfer level: Needs assistance Equipment used: Rolling walker (2 wheeled) Transfers: Sit to/from Stand Sit to Stand: Min assist General transfer comment: cues for hand placement and RLE position Ambulation/Gait Ambulation/Gait assistance: Min assist;Min guard Ambulation Distance (Feet): 55 Feet Assistive device: Rolling walker (2 wheeled) Gait Pattern/deviations: Step-to pattern Gait velocity: decreased General Gait Details: cues for  technique and sequencing    Exercises  10 reps B LE AP 10 reps R LE knee presses    PT Goals (current goals can now be found in the care plan section)     Visit Information  Last PT Received On: 02/05/13 Assistance Needed: +1 History of Present Illness: s/p right TKA    Subjective Data      Cognition       Balance     End of Session PT - End of Session Equipment Utilized During Treatment: Gait belt;Right knee immobilizer Activity Tolerance: Patient tolerated treatment well Patient left: in chair;with call bell/phone within reach;with family/visitor present   Rica Koyanagi  PTA [facility]  Acute  Rehab Pager      [phone]

## 2013-02-05 NOTE — Progress Notes (Signed)
Clinical Social Work Department BRIEF PSYCHOSOCIAL ASSESSMENT 02/05/2013  Patient:  Carolyn Swanson, Carolyn Swanson     Account Number:  [id number]     Admit date:  02/04/2013  Clinical Social Worker:  Lacie Scotts  Date/Time:  02/05/2013 03:30 PM  Referred by:  Physician  Date Referred:  02/05/2013 Referred for  SNF Placement   Other Referral:   Interview type:  Patient Other interview type:    PSYCHOSOCIAL DATA Living Status:  HUSBAND Admitted from facility:   Level of care:   Primary support name:  Floyde Parkins Primary support relationship to patient:  SPOUSE Degree of support available:   supportive    CURRENT CONCERNS Current Concerns  Post-Acute Placement   Other Concerns:    SOCIAL WORK ASSESSMENT / PLAN Pt is a 68 yr old female living at home prior to hospitalization. CSW met with / family to assist with d/c planning. Pt has made prior arrangements to have [facility] Rehab at [facility] following hospital d/c. CSW has contacted SNF and d/c plan has been confirmed. CSW will continue to follow to assist with d/c planning needs.   Assessment/plan status:  Psychosocial Support/Ongoing Assessment of Needs Other assessment/ plan:   Information/referral to community resources:   Insurance coverage for SNF and ambulance transport reviewed.    PATIENT'S/FAMILY'S RESPONSE TO PLAN OF CARE: Pt is looking forward to having rehab at [facility].     Werner Lean LCSW [phone]

## 2013-02-05 NOTE — Progress Notes (Signed)
Physical Therapy Treatment Patient Details Name: AMAI CAPPIELLO MRN: [id number] DOB: 12-04-1945 Today's Date: 02/05/2013 Time: 3491-7915 PT Time Calculation (min): 12 min  PT Assessment / Plan / Recommendation  History of Present Illness s/p right TKA   PT Comments   POD # 1 pm session.  Pt back in bed via OT so performed TKR TE's followed by ICE.  Pt groggy/sleepy during session.   Follow Up Recommendations  Home health PT  @ [facility]     Does the patient have the potential to tolerate intense rehabilitation     Barriers to Discharge        Equipment Recommendations  None recommended by PT    Recommendations for Other Services    Frequency 7X/week   Progress towards PT Goals Progress towards PT goals: Progressing toward goals  Plan      Precautions / Restrictions Precautions Precautions: Knee;Fall Precaution Comments: instructed pt on KI use for amb Required Braces or Orthoses: Knee Immobilizer - Right Restrictions Weight Bearing Restrictions: No Other Position/Activity Restrictions: WBAT   Pertinent Vitals/Pain  C/o 4/10 Pre medicated ICE applied       Exercises   Total Knee Replacement TE's 10 reps B LE ankle pumps 10 reps towel squeezes 10 reps knee presses 10 reps heel slides  10 reps SAQ's 10 reps SLR's 10 reps ABD Followed by ICE      PT Goals (current goals can now be found in the care plan section) Acute Rehab PT Goals Patient Stated Goal: I after rehab  Visit Information  Last PT Received On: 02/05/13 Assistance Needed: +1 History of Present Illness: s/p right TKA    Subjective Data  Patient Stated Goal: I after rehab   Cognition  Cognition Arousal/Alertness:  (drowsy but arousable) Behavior During Therapy: WFL for tasks assessed/performed Overall Cognitive Status:  (a little confused from pain meds)    Balance     End of Session PT - End of Session Equipment Utilized During Treatment: Gait belt;Right knee immobilizer Activity  Tolerance: Patient tolerated treatment well Patient left: in bed;with call bell/phone within reach;with family/visitor present   Rica Koyanagi  PTA [facility]  Acute  Rehab Pager      [phone]

## 2013-02-05 NOTE — Discharge Instructions (Addendum)
° °Dr. Frank Aluisio °Total Joint Specialist °[facility] °[address] °[place], [place] [address] °[phone] ° °TOTAL KNEE REPLACEMENT POSTOPERATIVE DIRECTIONS ° ° ° °Knee Rehabilitation, Guidelines Following Surgery  °Results after knee surgery are often greatly improved when you follow the exercise, range of motion and muscle strengthening exercises prescribed by your doctor. Safety measures are also important to protect the knee from further injury. Any time any of these exercises cause you to have increased pain or swelling in your knee joint, decrease the amount until you are comfortable again and slowly increase them. If you have problems or questions, call your caregiver or physical therapist for advice.  ° °HOME CARE INSTRUCTIONS  °Remove items at home which could result in a fall. This includes throw rugs or furniture in walking pathways.  °Continue medications as instructed at time of discharge. °You may have some home medications which will be placed on hold until you complete the course of blood thinner medication.  °You may start showering once you are discharged home but do not submerge the incision under water. Just pat the incision dry and apply a dry gauze dressing on daily. °Walk with walker as instructed.  °You may resume a sexual relationship in one month or when given the OK by  your doctor.  °· Use walker as long as suggested by your caregivers. °· Avoid periods of inactivity such as sitting longer than an hour when not asleep. This helps prevent blood clots.  °You may put full weight on your legs and walk as much as is comfortable.  °You may return to work once you are cleared by your doctor.  °Do not drive a car for 6 weeks or until released by you surgeon.  °· Do not drive while taking narcotics.  °Wear the elastic stockings for three weeks following surgery during the day but you may remove then at night. °Make sure you keep all of your appointments after your  operation with all of your doctors and caregivers. You should call the office at the above phone number and make an appointment for approximately two weeks after the date of your surgery. °Change the dressing daily and reapply a dry dressing each time. °Please pick up a stool softener and laxative for home use as long as you are requiring pain medications. °· Continue to use ice on the knee for pain and swelling from surgery. You may notice swelling that will progress down to the foot and ankle.  This is normal after surgery.  Elevate the leg when you are not up walking on it.   °It is important for you to complete the blood thinner medication as prescribed by your doctor. °· Continue to use the breathing machine which will help keep your temperature down.  It is common for your temperature to cycle up and down following surgery, especially at night when you are not up moving around and exerting yourself.  The breathing machine keeps your lungs expanded and your temperature down. ° °RANGE OF MOTION AND STRENGTHENING EXERCISES  °Rehabilitation of the knee is important following a knee injury or an operation. After just a few days of immobilization, the muscles of the thigh which control the knee become weakened and shrink (atrophy). Knee exercises are designed to build up the tone and strength of the thigh muscles and to improve knee motion. Often times heat used for twenty to thirty minutes before working out will loosen up your tissues and help with improving the   range of motion but do not use heat for the first two weeks following surgery. These exercises can be done on a training (exercise) mat, on the floor, on a table or on a bed. Use what ever works the best and is most comfortable for you Knee exercises include:  Leg Lifts - While your knee is still immobilized in a splint or cast, you can do straight leg raises. Lift the leg to 60 degrees, hold for 3 sec, and slowly lower the leg. Repeat 10-20 times 2-3  times daily. Perform this exercise against resistance later as your knee gets better.  Quad and Hamstring Sets - Tighten up the muscle on the front of the thigh (Quad) and hold for 5-10 sec. Repeat this 10-20 times hourly. Hamstring sets are done by pushing the foot backward against an object and holding for 5-10 sec. Repeat as with quad sets.  A rehabilitation program following serious knee injuries can speed recovery and prevent re-injury in the future due to weakened muscles. Contact your doctor or a physical therapist for more information on knee rehabilitation.   SKILLED REHAB INSTRUCTIONS: If the patient is transferred to a skilled rehab facility following release from the hospital, a list of the current medications will be sent to the facility for the patient to continue.  When discharged from the skilled rehab facility, please have the facility set up the patient's [place] prior to being released. Also, the skilled facility will be responsible for providing the patient with their medications at time of release from the facility to include their pain medication, the muscle relaxants, and their blood thinner medication. If the patient is still at the rehab facility at time of the two week follow up appointment, the skilled rehab facility will also need to assist the patient in arranging follow up appointment in our office and any transportation needs.  MAKE SURE YOU:  Understand these instructions.  Will watch your condition.  Will get help right away if you are not doing well or get worse.    Pick up stool softner and laxative for home. Do not submerge incision under water. May shower. Continue to use ice for pain and swelling from surgery.  Take Xarelto for two and a half more weeks, then discontinue Xarelto. Once the patient has completed the Xarelto, they may resume the 81 mg Aspirin.  When discharged from the skilled rehab facility, please have the facility set up  the patient's [place] prior to being released.  Also provide the patient with their medications at time of release from the facility to include their pain medication, the muscle relaxants, and their blood thinner medication.  If the patient is still at the rehab facility at time of follow up appointment, please also assist the patient in arranging follow up appointment in our office and any transportation needs.     Information on my medicine - XARELTO (Rivaroxaban)  This medication education was reviewed with me or my healthcare representative as part of my discharge preparation.  The pharmacist that spoke with me during my hospital stay was:  Absher, Julieta Bellini, RPH  Why was Xarelto prescribed for you? Xarelto was prescribed for you to reduce the risk of blood clots forming after orthopedic surgery.  What do you need to know about xarelto ? Take your Xarelto ONCE DAILY at the same time every day. If you have difficulty swallowing the tablet whole, you may crush it and mix in applesauce just  prior to taking your dose.  Take Xarelto exactly as prescribed by your doctor and DO NOT stop taking Xarelto without talking to the doctor who prescribed the medication.  Stopping without other VTE prevention medication to take the place of Xarelto may increase your risk of developing a new clot.    After discharge, you should have regular check-up appointments with your healthcare provider that is prescribing your Xarelto.    What do you do if you miss a dose? If you miss a dose, take it as soon as you remember on the same day then continue your regularly scheduled once daily regimen the next day. Do not take two doses of Xarelto at the same time.   Important Safety Information A possible side effect of Xarelto is bleeding. You should call your healthcare provider right away if you experience any of the following:   Bleeding from an injury or your nose that does not  stop.   Unusual colored urine (red or dark brown) or unusual colored stools (red or black).   Unusual bruising for unknown reasons.   A serious fall or if you hit your head (even if there is no bleeding).  Some medicines may interact with Xarelto and might increase your risk of bleeding while on Xarelto. To help avoid this, consult your healthcare provider or pharmacist prior to using any new prescription or non-prescription medications, including herbals, vitamins, non-steroidal anti-inflammatory drugs (NSAIDs) and supplements.  This website has more information on Xarelto: [email or web address]

## 2013-02-06 LAB — BASIC METABOLIC PANEL
BUN: 27 mg/dL — AB (ref 6–23)
CO2: 24 mEq/L (ref 19–32)
Calcium: 8.8 mg/dL (ref 8.4–10.5)
Chloride: 102 mEq/L (ref 96–112)
Creatinine, Ser: 1.57 mg/dL — ABNORMAL HIGH (ref 0.50–1.10)
GFR calc Af Amer: 38 mL/min — ABNORMAL LOW (ref >90)
GFR, EST NON AFRICAN AMERICAN: 33 mL/min — AB (ref >90)
GLUCOSE: 162 mg/dL — AB (ref 70–99)
POTASSIUM: 5.2 meq/L (ref 3.7–5.3)
Sodium: 137 mEq/L (ref 137–147)

## 2013-02-06 LAB — CBC
HEMATOCRIT: 27.8 % — AB (ref 36.0–46.0)
HEMOGLOBIN: 9.2 g/dL — AB (ref 12.0–15.0)
MCH: 31.6 pg (ref 26.0–34.0)
MCHC: 33.1 g/dL (ref 30.0–36.0)
MCV: 95.5 fL (ref 78.0–100.0)
Platelets: 290 10*3/uL (ref 150–400)
RBC: 2.91 MIL/uL — ABNORMAL LOW (ref 3.87–5.11)
RDW: 13.4 % (ref 11.5–15.5)
WBC: 14 10*3/uL — ABNORMAL HIGH (ref 4.0–10.5)

## 2013-02-06 MED ORDER — POLYSACCHARIDE IRON COMPLEX 150 MG PO CAPS
150.0000 mg | ORAL_CAPSULE | Freq: Every day | ORAL | Status: DC
  Administered 2013-02-06 – 2013-02-07 (×2): 150 mg via ORAL
  Filled 2013-02-06 (×2): qty 1

## 2013-02-06 NOTE — Progress Notes (Signed)
   Subjective: 2 Days Post-Op Procedure(s) (LRB): RIGHT TOTAL KNEE ARTHROPLASTY (Right) Patient reports pain as mild.   Patient seen in rounds with Dr. Wynelle Link.  Family in room. Patient is well, but has had some minor complaints of pain in the knee, requiring pain medications Plan is to go [facility] after hospital stay.  Objective: Vital signs in last 24 hours: Temp:  [98 F (36.7 C)-98.6 F (37 C)] 98 F (36.7 C) (01/21 0500) Pulse Rate:  [74-114] 85 (01/21 0500) Resp:  [18-20] 18 (01/21 0500) BP: (97-178)/(65-91) 97/66 mmHg (01/21 0500) SpO2:  [81 %-99 %] 93 % (01/21 0500)  Intake/Output from previous day:  Intake/Output Summary (Last 24 hours) at 02/06/13 0720 Last data filed at 02/05/13 2149  Gross per 24 hour  Intake 1693.83 ml  Output    600 ml  Net 1093.83 ml     Labs:  Recent Labs  02/05/13 0431 02/06/13 0425  HGB 9.6* 9.2*    Recent Labs  02/05/13 0431 02/06/13 0425  WBC 13.8* 14.0*  RBC 3.08* 2.91*  HCT 29.0* 27.8*  PLT 287 290    Recent Labs  02/05/13 0431 02/06/13 0425  NA 138 137  K 5.3 5.2  CL 102 102  CO2 23 24  BUN 27* 27*  CREATININE 1.48* 1.57*  GLUCOSE 175* 162*  CALCIUM 8.6 8.8   No results found for this basename: LABPT, INR,  in the last 72 hours  EXAM General - Patient is Alert, Appropriate and Oriented Extremity - Neurovascular intact Sensation intact distally Dorsiflexion/Plantar flexion intact No cellulitis present Dressing/Incision - clean, dry, no drainage, healing Motor Function - intact, moving foot and toes well on exam.   Past Medical History  Diagnosis Date  . Complication of anesthesia     "I was crazy for about 4 days after surgery"  . Hypertension   . Hyperlipidemia   . Arthritis   . History of nephritis     as child - no residual problems  . Anxiety     Assessment/Plan: 2 Days Post-Op Procedure(s) (LRB): RIGHT TOTAL KNEE ARTHROPLASTY (Right) Principal Problem:   OA (osteoarthritis) of  knee  Estimated body mass index is 34.29 kg/(m^2) as calculated from the following:   Height as of this encounter: 5\' 7"  (1.702 m).   Weight as of this encounter: 219 lb (99.338 kg). Up with therapy Plan for discharge tomorrow Discharge to SNF  DVT Prophylaxis - Xarelto Weight-Bearing as tolerated to right leg  Reagen Goates V 02/06/2013, 7:20 AM

## 2013-02-06 NOTE — Progress Notes (Signed)
Physical Therapy Treatment Patient Details Name: Carolyn Swanson MRN: [id number] DOB: Mar 15, 1945 Today's Date: 02/06/2013 Time: 1345-1410 PT Time Calculation (min): 25 min  PT Assessment / Plan / Recommendation  History of Present Illness s/p right TKA   PT Comments   POD # 2 pm session.  Assisted to [facility] then amb in hallway.  Pt demon decreased safety cognition (meds?) and requires instruction for direction to complete tasks.  Assisted back to bed for CPM.   Follow Up Recommendations  SNF [facility])     Does the patient have the potential to tolerate intense rehabilitation     Barriers to Discharge        Equipment Recommendations       Recommendations for Other Services    Frequency 7X/week   Progress towards PT Goals Progress towards PT goals: Progressing toward goals  Plan      Precautions / Restrictions Precautions Precautions: Knee;Fall Precaution Comments: instructed pt on KI use for amb Required Braces or Orthoses: Knee Immobilizer - Right Knee Immobilizer - Right: Discontinue once straight leg raise with < 10 degree lag Restrictions Weight Bearing Restrictions: No Other Position/Activity Restrictions: WBAT    Pertinent Vitals/Pain C/o 6/10 pain meds given ICE applied    Mobility  Bed Mobility Overal bed mobility: Needs Assistance Bed Mobility: Sit to Supine Supine to sit: Min assist Sit to supine: Min assist General bed mobility comments: Min assist to support R LE up onto bed Transfers Overall transfer level: Needs assistance Equipment used: Rolling walker (2 wheeled) Transfers: Sit to/from Stand Sit to Stand: Min assist General transfer comment: cues for hand placement and RLE position Ambulation/Gait Ambulation/Gait assistance: Min guard;Min assist Ambulation Distance (Feet): 64 Feet Assistive device: Rolling walker (2 wheeled) Gait Pattern/deviations: Step-to pattern;Decreased stance time - right Gait velocity: decreased General Gait Details:  cues for  technique and sequencing.  Pt still demon decreased safety cognition and c/o feeling "loopy"  (meds?)     PT Goals (current goals can now be found in the care plan section)    Visit Information  Last PT Received On: 02/06/13 Assistance Needed: +1 History of Present Illness: s/p right TKA    Subjective Data      Cognition       Balance     End of Session PT - End of Session Equipment Utilized During Treatment: Gait belt;Right knee immobilizer Activity Tolerance: Patient tolerated treatment well Patient left: in bed;with call bell/phone within reach;with family/visitor present CPM Right Knee CPM Right Knee: Off   Rica Koyanagi  PTA [facility]  Acute  Rehab Pager      [phone]

## 2013-02-06 NOTE — Progress Notes (Signed)
Physical Therapy Treatment Patient Details Name: ZYONNA VARDAMAN MRN: [id number] DOB: [birth year] Today's Date: 02/06/2013 Time: 6222-9798 PT Time Calculation (min): 40 min  PT Assessment / Plan / Recommendation  History of Present Illness s/p right TKA   PT Comments   POD # 2 am session.  Applied Ki and assisted pt OOB to amb in hallway.  Positioned in recliner then performed TKR TE's followed by ICE.   Follow Up Recommendations  SNF [facility])     Does the patient have the potential to tolerate intense rehabilitation     Barriers to Discharge        Equipment Recommendations       Recommendations for Other Services    Frequency 7X/week   Progress towards PT Goals Progress towards PT goals: Progressing toward goals  Plan      Precautions / Restrictions Precautions Precautions: Knee;Fall Precaution Comments: instructed pt on KI use for amb Required Braces or Orthoses: Knee Immobilizer - Right Knee Immobilizer - Right: Discontinue once straight leg raise with < 10 degree lag Restrictions Weight Bearing Restrictions: No Other Position/Activity Restrictions: WBAT    Pertinent Vitals/Pain C/o 5/10 pain Pre medicated ICE applied    Mobility  Bed Mobility Overal bed mobility: Needs Assistance Bed Mobility: Supine to Sit Supine to sit: Min assist General bed mobility comments: cues for technique Transfers Overall transfer level: Needs assistance Equipment used: Rolling walker (2 wheeled) Transfers: Sit to/from Stand Sit to Stand: Min assist General transfer comment: cues for hand placement and RLE position Ambulation/Gait Ambulation/Gait assistance: Min guard;Min assist Ambulation Distance (Feet): 75 Feet Assistive device: Rolling walker (2 wheeled) Gait Pattern/deviations: Step-to pattern Gait velocity: decreased General Gait Details: cues for  technique and sequencing    Exercises   Total Knee Replacement TE's 10 reps B LE ankle pumps 10 reps towel  squeezes 10 reps knee presses 10 reps heel slides  10 reps SAQ's 10 reps SLR's 10 reps ABD Followed by ICE    PT Goals (current goals can now be found in the care plan section)    Visit Information  Last PT Received On: 02/06/13 Assistance Needed: +1 History of Present Illness: s/p right TKA    Subjective Data      Cognition       Balance     End of Session PT - End of Session Equipment Utilized During Treatment: Gait belt;Right knee immobilizer Activity Tolerance: Patient tolerated treatment well Patient left: in chair;with call bell/phone within reach;with family/visitor present   Rica Koyanagi  PTA [facility]  Acute  Rehab Pager      [phone]

## 2013-02-07 ENCOUNTER — Encounter: Payer: Self-pay | Admitting: Internal Medicine

## 2013-02-07 DIAGNOSIS — D62 Acute posthemorrhagic anemia: Secondary | ICD-10-CM

## 2013-02-07 LAB — CBC
HEMATOCRIT: 27.1 % — AB (ref 36.0–46.0)
Hemoglobin: 8.6 g/dL — ABNORMAL LOW (ref 12.0–15.0)
MCH: 30.5 pg (ref 26.0–34.0)
MCHC: 31.7 g/dL (ref 30.0–36.0)
MCV: 96.1 fL (ref 78.0–100.0)
Platelets: 244 10*3/uL (ref 150–400)
RBC: 2.82 MIL/uL — ABNORMAL LOW (ref 3.87–5.11)
RDW: 13.5 % (ref 11.5–15.5)
WBC: 9.4 10*3/uL (ref 4.0–10.5)

## 2013-02-07 MED ORDER — ATORVASTATIN CALCIUM 40 MG PO TABS
40.0000 mg | ORAL_TABLET | Freq: Every day | ORAL | Status: DC
Start: 2013-02-07 — End: 2019-11-18

## 2013-02-07 MED ORDER — OXYCODONE HCL 5 MG PO TABS: 5.0000 mg | ORAL_TABLET | ORAL | Status: DC | PRN

## 2013-02-07 MED ORDER — TRAMADOL HCL 50 MG PO TABS: 50.0000 mg | ORAL_TABLET | Freq: Four times a day (QID) | ORAL | Status: DC | PRN

## 2013-02-07 MED ORDER — ACETAMINOPHEN 325 MG PO TABS: 650.0000 mg | ORAL_TABLET | Freq: Four times a day (QID) | ORAL | Status: DC | PRN

## 2013-02-07 MED ORDER — METOCLOPRAMIDE HCL 5 MG PO TABS: 5.0000 mg | ORAL_TABLET | Freq: Three times a day (TID) | ORAL | Status: DC | PRN

## 2013-02-07 MED ORDER — POLYSACCHARIDE IRON COMPLEX 150 MG PO CAPS: 150.0000 mg | ORAL_CAPSULE | Freq: Every day | ORAL | Status: DC

## 2013-02-07 MED ORDER — ONDANSETRON HCL 4 MG PO TABS: 4.0000 mg | ORAL_TABLET | Freq: Four times a day (QID) | ORAL | Status: DC | PRN

## 2013-02-07 MED ORDER — CARVEDILOL 12.5 MG PO TABS: 12.5000 mg | ORAL_TABLET | Freq: Two times a day (BID) | ORAL | Status: DC

## 2013-02-07 MED ORDER — METHOCARBAMOL 500 MG PO TABS: 500.0000 mg | ORAL_TABLET | Freq: Four times a day (QID) | ORAL | Status: DC | PRN

## 2013-02-07 MED ORDER — BISACODYL 10 MG RE SUPP: 10.0000 mg | Freq: Every day | RECTAL | Status: DC | PRN

## 2013-02-07 MED ORDER — DSS 100 MG PO CAPS: 100.0000 mg | ORAL_CAPSULE | Freq: Two times a day (BID) | ORAL | Status: DC

## 2013-02-07 MED ORDER — LISINOPRIL 10 MG PO TABS
10.0000 mg | ORAL_TABLET | Freq: Every day | ORAL | Status: DC
  Administered 2013-02-07: 10 mg via ORAL
  Filled 2013-02-07 (×2): qty 1

## 2013-02-07 MED ORDER — RIVAROXABAN 10 MG PO TABS
10.0000 mg | ORAL_TABLET | Freq: Every day | ORAL | Status: DC
Start: 2013-02-07 — End: 2013-06-20

## 2013-02-07 MED ORDER — VENLAFAXINE HCL ER 75 MG PO CP24: 75.0000 mg | ORAL_CAPSULE | Freq: Every day | ORAL | Status: DC

## 2013-02-07 MED ORDER — LISINOPRIL-HYDROCHLOROTHIAZIDE 10-12.5 MG PO TABS: 1.0000 | ORAL_TABLET | Freq: Every day | ORAL | Status: DC

## 2013-02-07 MED ORDER — POLYETHYLENE GLYCOL 3350 17 G PO PACK: 17.0000 g | PACK | Freq: Every day | ORAL | Status: DC | PRN

## 2013-02-07 MED ORDER — HYDROCHLOROTHIAZIDE 12.5 MG PO CAPS
12.5000 mg | ORAL_CAPSULE | Freq: Every day | ORAL | Status: DC
  Administered 2013-02-07: 12.5 mg via ORAL
  Filled 2013-02-07 (×2): qty 1

## 2013-02-07 NOTE — Progress Notes (Signed)
Physical Therapy Treatment Patient Details Name: PLESHETTE TOMASINI MRN: [id number] DOB: 04/27/1945 Today's Date: 02/07/2013 Time: 6812-7517 PT Time Calculation (min): 25 min  PT Assessment / Plan / Recommendation  History of Present Illness s/p right TKA   PT Comments   POD # 3 am session.  Assisted pt OOB to amb in hallway then to recliner.  Pt still "groggy" with decreased safety cognition. Requires increased time and VC's for safety and hand placement esp with stand to sit.   Follow Up Recommendations  SNF [facility])     Does the patient have the potential to tolerate intense rehabilitation     Barriers to Discharge        Equipment Recommendations       Recommendations for Other Services    Frequency 7X/week   Progress towards PT Goals Progress towards PT goals: Progressing toward goals  Plan      Precautions / Restrictions Precautions Precautions: Knee;Fall Precaution Comments: instructed pt on KI use for amb Required Braces or Orthoses: Knee Immobilizer - Right Knee Immobilizer - Right: Discontinue once straight leg raise with < 10 degree lag Restrictions Weight Bearing Restrictions: No Other Position/Activity Restrictions: WBAT    Pertinent Vitals/Pain C/o 3/10 Pre medicated    Mobility  Bed Mobility Overal bed mobility: Needs Assistance Bed Mobility: Supine to Sit Supine to sit: Min assist General bed mobility comments: Min assist to support R LE  Transfers Overall transfer level: Needs assistance Equipment used: Rolling walker (2 wheeled) Transfers: Sit to/from Stand Sit to Stand: Min assist General transfer comment: cues for hand placement and RLE position Ambulation/Gait Ambulation/Gait assistance: Min guard;Min assist Ambulation Distance (Feet): 75 Feet Assistive device: Rolling walker (2 wheeled) Gait Pattern/deviations: Step-to pattern;Decreased stance time - right Gait velocity: decreased General Gait Details: 50% VC's on safety with turns and  drunken/unsteady gait    Exercises  B AP B knee presses    PT Goals (current goals can now be found in the care plan section)    Visit Information  Last PT Received On: 02/07/13 Assistance Needed: +1 History of Present Illness: s/p right TKA    Subjective Data      Cognition       Balance     End of Session PT - End of Session Equipment Utilized During Treatment: Gait belt;Right knee immobilizer Activity Tolerance: Patient tolerated treatment well Patient left: in chair;with call bell/phone within reach;with family/visitor present   Rica Koyanagi  PTA [facility]  Acute  Rehab Pager      [phone]

## 2013-02-07 NOTE — Discharge Summary (Signed)
Physician Discharge Summary   Patient ID: Carolyn Swanson MRN: [id number] DOB/AGE: [birth year] 69 y.o.  Admit date: 02/04/2013 Discharge date: 02-07-2013  Primary Diagnosis:  Osteoarthritis Right knee(s)  Admission Diagnoses:  Past Medical History  Diagnosis Date  . Complication of anesthesia     "I was crazy for about 4 days after surgery"  . Hypertension   . Hyperlipidemia   . Arthritis   . History of nephritis     as child - no residual problems  . Anxiety    Discharge Diagnoses:   Principal Problem:   OA (osteoarthritis) of knee Active Problems:   Postoperative anemia due to acute blood loss  Estimated body mass index is 34.29 kg/(m^2) as calculated from the following:   Height as of this encounter: 5' 7" (1.702 m).   Weight as of this encounter: 99.338 kg (219 lb).  Procedure:  Procedure(s) (LRB): RIGHT TOTAL KNEE ARTHROPLASTY (Right)   Consults: None  HPI: Carolyn Swanson is a 68 y.o. year old female with end stage OA of her right knee with progressively worsening pain and dysfunction. She has constant pain, with activity and at rest and significant functional deficits with difficulties even with ADLs. She has had extensive non-op management including analgesics, injections of cortisone and viscosupplements, and home exercise program, but remains in significant pain with significant dysfunction.Radiographs show bone on bone arthritis medial and patellofemoral. She presents now for right Total Knee Arthroplasty.   Laboratory Data: Admission on 02/04/2013  Component Date Value Range Status  . WBC 02/05/2013 13.8* 4.0 - 10.5 K/uL Final  . RBC 02/05/2013 3.08* 3.87 - 5.11 MIL/uL Final  . Hemoglobin 02/05/2013 9.6* 12.0 - 15.0 g/dL Final  . HCT 02/05/2013 29.0* 36.0 - 46.0 % Final  . MCV 02/05/2013 94.2  78.0 - 100.0 fL Final  . MCH 02/05/2013 31.2  26.0 - 34.0 pg Final  . MCHC 02/05/2013 33.1  30.0 - 36.0 g/dL Final  . RDW 02/05/2013 13.1  11.5 - 15.5 % Final  .  Platelets 02/05/2013 287  150 - 400 K/uL Final  . Sodium 02/05/2013 138  137 - 147 mEq/L Final  . Potassium 02/05/2013 5.3  3.7 - 5.3 mEq/L Final  . Chloride 02/05/2013 102  96 - 112 mEq/L Final  . CO2 02/05/2013 23  19 - 32 mEq/L Final  . Glucose, Bld 02/05/2013 175* 70 - 99 mg/dL Final  . BUN 02/05/2013 27* 6 - 23 mg/dL Final  . Creatinine, Ser 02/05/2013 1.48* 0.50 - 1.10 mg/dL Final  . Calcium 02/05/2013 8.6  8.4 - 10.5 mg/dL Final  . GFR calc non Af Amer 02/05/2013 35* >90 mL/min Final  . GFR calc Af Amer 02/05/2013 41* >90 mL/min Final   Comment: (NOTE)                          The eGFR has been calculated using the CKD EPI equation.                          This calculation has not been validated in all clinical situations.                          eGFR's persistently <90 mL/min signify possible Chronic Kidney                          Disease.  Marland Kitchen  WBC 02/06/2013 14.0* 4.0 - 10.5 K/uL Final  . RBC 02/06/2013 2.91* 3.87 - 5.11 MIL/uL Final  . Hemoglobin 02/06/2013 9.2* 12.0 - 15.0 g/dL Final  . HCT 02/06/2013 27.8* 36.0 - 46.0 % Final  . MCV 02/06/2013 95.5  78.0 - 100.0 fL Final  . MCH 02/06/2013 31.6  26.0 - 34.0 pg Final  . MCHC 02/06/2013 33.1  30.0 - 36.0 g/dL Final  . RDW 02/06/2013 13.4  11.5 - 15.5 % Final  . Platelets 02/06/2013 290  150 - 400 K/uL Final  . Sodium 02/06/2013 137  137 - 147 mEq/L Final  . Potassium 02/06/2013 5.2  3.7 - 5.3 mEq/L Final  . Chloride 02/06/2013 102  96 - 112 mEq/L Final  . CO2 02/06/2013 24  19 - 32 mEq/L Final  . Glucose, Bld 02/06/2013 162* 70 - 99 mg/dL Final  . BUN 02/06/2013 27* 6 - 23 mg/dL Final  . Creatinine, Ser 02/06/2013 1.57* 0.50 - 1.10 mg/dL Final  . Calcium 02/06/2013 8.8  8.4 - 10.5 mg/dL Final  . GFR calc non Af Amer 02/06/2013 33* >90 mL/min Final  . GFR calc Af Amer 02/06/2013 38* >90 mL/min Final   Comment: (NOTE)                          The eGFR has been calculated using the CKD EPI equation.                           This calculation has not been validated in all clinical situations.                          eGFR's persistently <90 mL/min signify possible Chronic Kidney                          Disease.  . WBC 02/07/2013 9.4  4.0 - 10.5 K/uL Final  . RBC 02/07/2013 2.82* 3.87 - 5.11 MIL/uL Final  . Hemoglobin 02/07/2013 8.6* 12.0 - 15.0 g/dL Final  . HCT 02/07/2013 27.1* 36.0 - 46.0 % Final  . MCV 02/07/2013 96.1  78.0 - 100.0 fL Final  . MCH 02/07/2013 30.5  26.0 - 34.0 pg Final  . MCHC 02/07/2013 31.7  30.0 - 36.0 g/dL Final  . RDW 02/07/2013 13.5  11.5 - 15.5 % Final  . Platelets 02/07/2013 244  150 - 400 K/uL Final  Hospital Outpatient Visit on 01/29/2013  Component Date Value Range Status  . MRSA, PCR 01/29/2013 NEGATIVE  NEGATIVE Final  . Staphylococcus aureus 01/29/2013 POSITIVE* NEGATIVE Final   Comment:                                 The Xpert SA Assay (FDA                          approved for NASAL specimens                          in patients over 17 years of age),                          is one component of  a comprehensive surveillance                          program.  Test performance has                          been validated by [facility] for patients greater                          than or equal to 59 year old.                          It is not intended                          to diagnose infection nor to                          guide or monitor treatment.  Marland Kitchen aPTT 01/29/2013 25  24 - 37 seconds Final  . WBC 01/29/2013 7.6  4.0 - 10.5 K/uL Final  . RBC 01/29/2013 3.61* 3.87 - 5.11 MIL/uL Final  . Hemoglobin 01/29/2013 11.2* 12.0 - 15.0 g/dL Final  . HCT 01/29/2013 34.3* 36.0 - 46.0 % Final  . MCV 01/29/2013 95.0  78.0 - 100.0 fL Final  . MCH 01/29/2013 31.0  26.0 - 34.0 pg Final  . MCHC 01/29/2013 32.7  30.0 - 36.0 g/dL Final  . RDW 01/29/2013 13.4  11.5 - 15.5 % Final  . Platelets 01/29/2013 285  150 - 400 K/uL  Final  . Sodium 01/29/2013 139  137 - 147 mEq/L Final  . Potassium 01/29/2013 5.2  3.7 - 5.3 mEq/L Final  . Chloride 01/29/2013 103  96 - 112 mEq/L Final  . CO2 01/29/2013 24  19 - 32 mEq/L Final  . Glucose, Bld 01/29/2013 141* 70 - 99 mg/dL Final  . BUN 01/29/2013 22  6 - 23 mg/dL Final  . Creatinine, Ser 01/29/2013 1.38* 0.50 - 1.10 mg/dL Final  . Calcium 01/29/2013 9.4  8.4 - 10.5 mg/dL Final  . Total Protein 01/29/2013 7.8  6.0 - 8.3 g/dL Final  . Albumin 01/29/2013 3.6  3.5 - 5.2 g/dL Final  . AST 01/29/2013 17  0 - 37 U/L Final  . ALT 01/29/2013 16  0 - 35 U/L Final  . Alkaline Phosphatase 01/29/2013 181* 39 - 117 U/L Final  . Total Bilirubin 01/29/2013 0.4  0.3 - 1.2 mg/dL Final  . GFR calc non Af Amer 01/29/2013 38* >90 mL/min Final  . GFR calc Af Amer 01/29/2013 44* >90 mL/min Final   Comment: (NOTE)                          The eGFR has been calculated using the CKD EPI equation.                          This calculation has not been validated in all clinical situations.                          eGFR's persistently <90  mL/min signify possible Chronic Kidney                          Disease.  Marland Kitchen Prothrombin Time 01/29/2013 13.2  11.6 - 15.2 seconds Final  . INR 01/29/2013 1.02  0.00 - 1.49 Final  . ABO/RH(D) 01/29/2013 O POS   Final  . Antibody Screen 01/29/2013 NEG   Final  . Sample Expiration 01/29/2013 02/07/2013   Final  . Color, Urine 01/29/2013 YELLOW  YELLOW Final  . APPearance 01/29/2013 CLOUDY* CLEAR Final  . Specific Gravity, Urine 01/29/2013 1.017  1.005 - 1.030 Final  . pH 01/29/2013 5.5  5.0 - 8.0 Final  . Glucose, UA 01/29/2013 NEGATIVE  NEGATIVE mg/dL Final  . Hgb urine dipstick 01/29/2013 NEGATIVE  NEGATIVE Final  . Bilirubin Urine 01/29/2013 NEGATIVE  NEGATIVE Final  . Ketones, ur 01/29/2013 NEGATIVE  NEGATIVE mg/dL Final  . Protein, ur 01/29/2013 NEGATIVE  NEGATIVE mg/dL Final  . Urobilinogen, UA 01/29/2013 0.2  0.0 - 1.0 mg/dL Final  . Nitrite  01/29/2013 NEGATIVE  NEGATIVE Final  . Leukocytes, UA 01/29/2013 NEGATIVE  NEGATIVE Final   MICROSCOPIC NOT DONE ON URINES WITH NEGATIVE PROTEIN, BLOOD, LEUKOCYTES, NITRITE, OR GLUCOSE <1000 mg/dL.     X-Rays:Dg Chest 2 View  01/29/2013   CLINICAL DATA:  Preoperative study prior to right knee surgery.  Marland Kitchen  EXAM: CHEST  2 VIEW  COMPARISON:  PA and lateral chest x-ray of April 15, 2010  FINDINGS: The lungs are adequately inflated. There is no focal infiltrate. The cardiopericardial silhouette is normal in size. The mediastinum is normal in width. There is no pleural effusion. The observed portions of the bony thorax are normal.  IMPRESSION: There is no evidence of active cardiopulmonary disease.   Electronically Signed   By: David  Martinique   On: 01/29/2013 13:21    EKG: Orders placed during the hospital encounter of 02/04/13  . EKG 12-LEAD  . EKG 12-LEAD  . EKG 12-LEAD     Hospital Course: Carolyn Swanson is a 68 y.o. who was admitted to [facility]. They were brought to the operating room on 02/04/2013 and underwent Procedure(s): RIGHT TOTAL KNEE ARTHROPLASTY.  Patient tolerated the procedure well and was later transferred to the recovery room and then to the orthopaedic floor for postoperative care.  They were given PO and IV analgesics for pain control following their surgery.  They were given 24 hours of postoperative antibiotics of  Anti-infectives   Start     Dose/Rate Route Frequency Ordered Stop   02/04/13 1430  ceFAZolin (ANCEF) IVPB 2 g/50 mL premix     2 g 100 mL/hr over 30 Minutes Intravenous Every 6 hours 02/04/13 1131 02/04/13 2113   02/04/13 0630  ceFAZolin (ANCEF) IVPB 2 g/50 mL premix     2 g 100 mL/hr over 30 Minutes Intravenous On call to O.R. 02/04/13 2778 02/04/13 0800     and started on DVT prophylaxis in the form of Xarelto.   PT and OT were ordered for total joint protocol.  Discharge planning consulted to help with postop disposition and equipment needs. Plan  was to go [facility] in [place], [place] after hospital stay. Patient had a decent night on the evening of surgery. She walked about 45 feet the day of surgery.  They started to get up OOB again with therapy on day one and walked over 50 feet. Hemovac drain was pulled without difficulty.  Continued to work  with therapy into day two and walked about 75 feet.  Dressing was changed on day two and the incision was healing well.  By day three, the patient had progressed with therapy and meeting their goals.  Incision was healing well.  Patient was seen in rounds and was ready to go to [facility] in [place], [place].   Discharge Medications: Prior to Admission medications   Medication Sig Start Date End Date Taking? Authorizing Provider  aspirin 81 MG tablet Take 81 mg by mouth daily.   Yes Historical Provider, MD  ibuprofen (ADVIL,MOTRIN) 200 MG tablet Take 600 mg by mouth 2 (two) times daily.   Yes Historical Provider, MD  acetaminophen (TYLENOL) 325 MG tablet Take 2 tablets (650 mg total) by mouth every 6 (six) hours as needed for mild pain (or Fever >/= 101). 02/07/13   Alexzandrew Perkins, PA-C  atorvastatin (LIPITOR) 40 MG tablet Take 1 tablet (40 mg total) by mouth at bedtime. 02/07/13   Alexzandrew Dara Lords, PA-C  bisacodyl (DULCOLAX) 10 MG suppository Place 1 suppository (10 mg total) rectally daily as needed for moderate constipation. 02/07/13   Alexzandrew Dara Lords, PA-C  carvedilol (COREG) 12.5 MG tablet Take 1 tablet (12.5 mg total) by mouth 2 (two) times daily with a meal. 02/07/13   Alexzandrew Perkins, PA-C  docusate sodium 100 MG CAPS Take 100 mg by mouth 2 (two) times daily. 02/07/13   Alexzandrew Dara Lords, PA-C  iron polysaccharides (NIFEREX) 150 MG capsule Take 1 capsule (150 mg total) by mouth daily. 02/07/13   Alexzandrew Dara Lords, PA-C  lisinopril-hydrochlorothiazide (PRINZIDE,ZESTORETIC) 10-12.5 MG per tablet Take 1 tablet by mouth daily with breakfast. 02/07/13   Alexzandrew Dara Lords, PA-C    methocarbamol (ROBAXIN) 500 MG tablet Take 1 tablet (500 mg total) by mouth every 6 (six) hours as needed for muscle spasms. 02/07/13   Alexzandrew Dara Lords, PA-C  metoCLOPramide (REGLAN) 5 MG tablet Take 1-2 tablets (5-10 mg total) by mouth every 8 (eight) hours as needed for nausea (if ondansetron (ZOFRAN) ineffective.). 02/07/13   Alexzandrew Perkins, PA-C  ondansetron (ZOFRAN) 4 MG tablet Take 1 tablet (4 mg total) by mouth every 6 (six) hours as needed for nausea. 02/07/13   Alexzandrew Perkins, PA-C  oxyCODONE (OXY IR/ROXICODONE) 5 MG immediate release tablet Take 1-2 tablets (5-10 mg total) by mouth every 3 (three) hours as needed for breakthrough pain. 02/07/13   Alexzandrew Perkins, PA-C  polyethylene glycol (MIRALAX / GLYCOLAX) packet Take 17 g by mouth daily as needed for mild constipation. 02/07/13   Alexzandrew Dara Lords, PA-C  rivaroxaban (XARELTO) 10 MG TABS tablet Take 1 tablet (10 mg total) by mouth daily with breakfast. Take Xarelto for two and a half more weeks, then discontinue Xarelto. Once the patient has completed the Xarelto, they may resume the 81 mg Aspirin. 02/07/13   Alexzandrew Perkins, PA-C  traMADol (ULTRAM) 50 MG tablet Take 1 tablet (50 mg total) by mouth every 6 (six) hours as needed (mild to moderate pain). 02/07/13   Alexzandrew Dara Lords, PA-C  venlafaxine XR (EFFEXOR-XR) 75 MG 24 hr capsule Take 1 capsule (75 mg total) by mouth at bedtime. 02/07/13   Alexzandrew Dara Lords, PA-C   Discharge to SNF  Diet - Cardiac diet  Follow up - in 2 weeks  Activity - WBAT  Disposition - Skilled nursing facility  Condition Upon Discharge - Good  D/C Meds - See DC Summary  DVT Prophylaxis - Xarelto  Discharge Orders   Future Orders Complete By Expires   Call MD / Call 911  As directed    Comments:     If you experience chest pain or shortness of breath, CALL 911 and be transported to the hospital emergency room.  If you develope a fever above 101 F, pus (white drainage) or increased  drainage or redness at the wound, or calf pain, call your surgeon's office.   Change dressing  As directed    Comments:     Change dressing daily with sterile 4 x 4 inch gauze dressing and apply TED hose. Do not submerge the incision under water.   Constipation Prevention  As directed    Comments:     Drink plenty of fluids.  Prune juice may be helpful.  You may use a stool softener, such as Colace (over the counter) 100 mg twice a day.  Use MiraLax (over the counter) for constipation as needed.   Diet - low sodium heart healthy  As directed    Discharge instructions  As directed    Comments:     Pick up stool softner and laxative for home. Do not submerge incision under water. May shower. Continue to use ice for pain and swelling from surgery.  Take Xarelto for two and a half more weeks, then discontinue Xarelto. Once the patient has completed the Xarelto, they may resume the 81 mg Aspirin.  When discharged from the skilled rehab facility, please have the facility set up the patient's [place] prior to being released.  Also provide the patient with their medications at time of release from the facility to include their pain medication, the muscle relaxants, and their blood thinner medication.  If the patient is still at the rehab facility at time of follow up appointment, please also assist the patient in arranging follow up appointment in our office and any transportation needs.   Do not put a pillow under the knee. Place it under the heel.  As directed    Do not sit on low chairs, stoools or toilet seats, as it may be difficult to get up from low surfaces  As directed    Driving restrictions  As directed    Comments:     No driving until released by the physician.   Increase activity slowly as tolerated  As directed    Lifting restrictions  As directed    Comments:     No lifting until released by the physician.   Patient may shower  As directed    Comments:      You may shower without a dressing once there is no drainage.  Do not wash over the wound.  If drainage remains, do not shower until drainage stops.   TED hose  As directed    Comments:     Use stockings (TED hose) for 3 weeks on both leg(s).  You may remove them at night for sleeping.   Weight bearing as tolerated  As directed    Questions:     Laterality:     Extremity:         Medication List    STOP taking these medications       aspirin 81 MG tablet     ibuprofen 200 MG tablet  Commonly known as:  ADVIL,MOTRIN      TAKE these medications       acetaminophen 325 MG tablet  Commonly known as:  TYLENOL  Take 2 tablets (650 mg total) by mouth every 6 (six) hours as needed for mild pain (or Fever >/=  101).     atorvastatin 40 MG tablet  Commonly known as:  LIPITOR  Take 1 tablet (40 mg total) by mouth at bedtime.     bisacodyl 10 MG suppository  Commonly known as:  DULCOLAX  Place 1 suppository (10 mg total) rectally daily as needed for moderate constipation.     carvedilol 12.5 MG tablet  Commonly known as:  COREG  Take 1 tablet (12.5 mg total) by mouth 2 (two) times daily with a meal.     DSS 100 MG Caps  Take 100 mg by mouth 2 (two) times daily.     iron polysaccharides 150 MG capsule  Commonly known as:  NIFEREX  Take 1 capsule (150 mg total) by mouth daily.     lisinopril-hydrochlorothiazide 10-12.5 MG per tablet  Commonly known as:  PRINZIDE,ZESTORETIC  Take 1 tablet by mouth daily with breakfast.     methocarbamol 500 MG tablet  Commonly known as:  ROBAXIN  Take 1 tablet (500 mg total) by mouth every 6 (six) hours as needed for muscle spasms.     metoCLOPramide 5 MG tablet  Commonly known as:  REGLAN  Take 1-2 tablets (5-10 mg total) by mouth every 8 (eight) hours as needed for nausea (if ondansetron (ZOFRAN) ineffective.).     ondansetron 4 MG tablet  Commonly known as:  ZOFRAN  Take 1 tablet (4 mg total) by mouth every 6 (six) hours as needed for  nausea.     oxyCODONE 5 MG immediate release tablet  Commonly known as:  Oxy IR/ROXICODONE  Take 1-2 tablets (5-10 mg total) by mouth every 3 (three) hours as needed for breakthrough pain.     polyethylene glycol packet  Commonly known as:  MIRALAX / GLYCOLAX  Take 17 g by mouth daily as needed for mild constipation.     rivaroxaban 10 MG Tabs tablet  Commonly known as:  XARELTO  - Take 1 tablet (10 mg total) by mouth daily with breakfast. Take Xarelto for two and a half more weeks, then discontinue Xarelto.  - Once the patient has completed the Xarelto, they may resume the 81 mg Aspirin.     traMADol 50 MG tablet  Commonly known as:  ULTRAM  Take 1 tablet (50 mg total) by mouth every 6 (six) hours as needed (mild to moderate pain).     venlafaxine XR 75 MG 24 hr capsule  Commonly known as:  EFFEXOR-XR  Take 1 capsule (75 mg total) by mouth at bedtime.           Follow-up Information   Follow up with Gearlean Alf, MD. Schedule an appointment as soon as possible for a visit in 2 weeks.   Specialty:  Orthopedic Surgery   Contact information:   [address] [place] [address] [phone]       Signed: Mickel Crow 02/07/2013, 8:52 AM

## 2013-02-07 NOTE — Progress Notes (Signed)
   Subjective: 3 Days Post-Op Procedure(s) (LRB): RIGHT TOTAL KNEE ARTHROPLASTY (Right) Patient reports pain as mild.   Patient seen in rounds by Dr. Wynelle Link. Patient is well, and has had no acute complaints or problems Patient is ready to go [facility] in [place], [place]  Objective: Vital signs in last 24 hours: Temp:  [98.1 F (36.7 C)-98.4 F (36.9 C)] 98.4 F (36.9 C) (01/22 0535) Pulse Rate:  [81-91] 91 (01/22 0535) Resp:  [18-20] 20 (01/22 0535) BP: (146-152)/(71-81) 152/71 mmHg (01/22 0535) SpO2:  [95 %-98 %] 98 % (01/22 0535)  Intake/Output from previous day:  Intake/Output Summary (Last 24 hours) at 02/07/13 0840 Last data filed at 02/07/13 0536  Gross per 24 hour  Intake    720 ml  Output    750 ml  Net    -30 ml    Intake/Output this shift:    Labs:  Recent Labs  02/05/13 0431 02/06/13 0425 02/07/13 0457  HGB 9.6* 9.2* 8.6*    Recent Labs  02/06/13 0425 02/07/13 0457  WBC 14.0* 9.4  RBC 2.91* 2.82*  HCT 27.8* 27.1*  PLT 290 244    Recent Labs  02/05/13 0431 02/06/13 0425  NA 138 137  K 5.3 5.2  CL 102 102  CO2 23 24  BUN 27* 27*  CREATININE 1.48* 1.57*  GLUCOSE 175* 162*  CALCIUM 8.6 8.8   No results found for this basename: LABPT, INR,  in the last 72 hours  EXAM: General - Patient is Alert, Appropriate and Oriented Extremity - Neurovascular intact Sensation intact distally Dorsiflexion/Plantar flexion intact Incision - clean, dry, no drainage, healing Motor Function - intact, moving foot and toes well on exam.   Assessment/Plan: 3 Days Post-Op Procedure(s) (LRB): RIGHT TOTAL KNEE ARTHROPLASTY (Right) Procedure(s) (LRB): RIGHT TOTAL KNEE ARTHROPLASTY (Right) Past Medical History  Diagnosis Date  . Complication of anesthesia     "I was crazy for about 4 days after surgery"  . Hypertension   . Hyperlipidemia   . Arthritis   . History of nephritis     as child - no residual problems  . Anxiety    Principal  Problem:   OA (osteoarthritis) of knee Active Problems:   Postoperative anemia due to acute blood loss  Estimated body mass index is 34.29 kg/(m^2) as calculated from the following:   Height as of this encounter: 5\' 7"  (1.702 m).   Weight as of this encounter: 99.338 kg (219 lb). Discharge to SNF Diet - Cardiac diet Follow up - in 2 weeks Activity - WBAT Disposition - Skilled nursing facility Condition Upon Discharge - Good D/C Meds - See DC Summary DVT Prophylaxis - Xarelto  Katarina Riebe 02/07/2013, 8:40 AM

## 2013-02-07 NOTE — Care Management Note (Signed)
    Page 1 of 1   02/07/2013     11:58:20 AM   CARE MANAGEMENT NOTE 02/07/2013  Patient:  Carolyn Swanson, Carolyn Swanson   Account Number:  [id number]  Date Initiated:  02/07/2013  Documentation initiated by:  Sherrin Daisy  Subjective/Objective Assessment:   dx total knee replacemnt-right     Action/Plan:   Plans are for SNF rehab.   Anticipated DC Date:  02/07/2013   Anticipated DC Plan:  SKILLED NURSING FACILITY  In-house referral  Clinical Social Worker      DC Planning Services  CM consult      Choice offered to / List presented to:             Status of service:  Completed, signed off Medicare Important Message given?  NA - LOS <3 / Initial given by admissions (If response is "NO", the following Medicare IM given date fields will be blank) Date Medicare IM given:   Date Additional Medicare IM given:    Discharge Disposition:  [facility]  Per UR Regulation:    If discussed at Long Length of Stay Meetings, dates discussed:    Comments:

## 2013-02-08 NOTE — Progress Notes (Signed)
Clinical Social Work Department CLINICAL SOCIAL WORK PLACEMENT NOTE 02/08/2013  Patient:  Carolyn Swanson, Carolyn Swanson  Account Number:  [id number] Admit date:  02/04/2013  Clinical Social Worker:  Werner Lean, LCSW  Date/time:  02/05/2013 03:46 PM  Clinical Social Work is seeking post-discharge placement for this patient at the following level of care:   SKILLED NURSING   (*CSW will update this form in Epic as items are completed)     Patient/family provided with [place] Department of Clinical Social Work's list of facilities offering this level of care within the geographic area requested by the patient (or if unable, by the patient's family).  02/05/2013  Patient/family informed of their freedom to choose among providers that offer the needed level of care, that participate in Medicare, Medicaid or managed care program needed by the patient, have an available bed and are willing to accept the patient.    Patient/family informed of [facility]' ownership interest in [facility], as well as of the fact that they are under no obligation to receive care at this facility.  PASARR submitted to EDS on  PASARR number received from EDS on 04/29/2010  FL2 transmitted to all facilities in geographic area requested by pt/family on  02/05/2013 FL2 transmitted to all facilities within larger geographic area on   Patient informed that his/her managed care company has contracts with or will negotiate with  certain facilities, including the following:     Patient/family informed of bed offers received:  02/05/2013 Patient chooses bed at [facility] Physician recommends and patient chooses bed at    Patient to be transferred to [facility] on  02/07/2013 Patient to be transferred to facility by [facility]  The following physician request were entered in Epic:   Additional Comments:  Werner Lean LCSW [phone]

## 2013-02-17 ENCOUNTER — Encounter: Payer: Self-pay | Admitting: Internal Medicine

## 2013-03-07 ENCOUNTER — Encounter: Payer: Self-pay | Admitting: Orthopedic Surgery

## 2013-03-17 ENCOUNTER — Encounter: Payer: Self-pay | Admitting: Orthopedic Surgery

## 2013-04-17 ENCOUNTER — Encounter: Payer: Self-pay | Admitting: Orthopedic Surgery

## 2013-05-23 LAB — COMPREHENSIVE METABOLIC PANEL
ANION GAP: 6 — AB (ref 7–16)
AST: 29 U/L (ref 15–37)
Albumin: 4.1 g/dL (ref 3.4–5.0)
Alkaline Phosphatase: 195 U/L — ABNORMAL HIGH
BUN: 19 mg/dL — AB (ref 7–18)
Bilirubin,Total: 0.7 mg/dL (ref 0.2–1.0)
Calcium, Total: 9.5 mg/dL (ref 8.5–10.1)
Chloride: 101 mmol/L (ref 98–107)
Co2: 30 mmol/L (ref 21–32)
Creatinine: 1.22 mg/dL (ref 0.60–1.30)
GFR CALC AF AMER: 52 — AB
GFR CALC NON AF AMER: 45 — AB
GLUCOSE: 117 mg/dL — AB (ref 65–99)
Osmolality: 277 (ref 275–301)
POTASSIUM: 3.8 mmol/L (ref 3.5–5.1)
SGPT (ALT): 35 U/L (ref 12–78)
SODIUM: 137 mmol/L (ref 136–145)
TOTAL PROTEIN: 8.5 g/dL — AB (ref 6.4–8.2)

## 2013-05-23 LAB — SEDIMENTATION RATE: ERYTHROCYTE SED RATE: 49 mm/h — AB (ref 0–30)

## 2013-05-23 LAB — CBC
HCT: 36.4 % (ref 35.0–47.0)
HGB: 12.1 g/dL (ref 12.0–16.0)
MCH: 29.6 pg (ref 26.0–34.0)
MCHC: 33.1 g/dL (ref 32.0–36.0)
MCV: 89 fL (ref 80–100)
Platelet: 221 10*3/uL (ref 150–440)
RBC: 4.07 10*6/uL (ref 3.80–5.20)
RDW: 16.4 % — AB (ref 11.5–14.5)
WBC: 6.4 10*3/uL (ref 3.6–11.0)

## 2013-05-23 LAB — APTT: ACTIVATED PTT: 28.6 s (ref 23.6–35.9)

## 2013-05-23 LAB — PROTIME-INR
INR: 1.3
Prothrombin Time: 16 secs — ABNORMAL HIGH (ref 11.5–14.7)

## 2013-05-23 LAB — TROPONIN I: Troponin-I: <0.02 ng/mL

## 2013-05-24 ENCOUNTER — Observation Stay: Payer: Self-pay | Admitting: Internal Medicine

## 2013-05-24 LAB — URINALYSIS, COMPLETE
BILIRUBIN, UR: NEGATIVE
Bacteria: NONE SEEN
Blood: NEGATIVE
Glucose,UR: NEGATIVE mg/dL (ref 0–75)
KETONE: NEGATIVE
Leukocyte Esterase: NEGATIVE
NITRITE: NEGATIVE
Ph: 5 (ref 4.5–8.0)
RBC,UR: <1 /HPF (ref 0–5)
Specific Gravity: 1.015 (ref 1.003–1.030)
Squamous Epithelial: 4

## 2013-05-24 LAB — BASIC METABOLIC PANEL
Anion Gap: 5 — ABNORMAL LOW (ref 7–16)
BUN: 17 mg/dL (ref 7–18)
CO2: 27 mmol/L (ref 21–32)
CREATININE: 1.41 mg/dL — AB (ref 0.60–1.30)
Calcium, Total: 8.8 mg/dL (ref 8.5–10.1)
Chloride: 108 mmol/L — ABNORMAL HIGH (ref 98–107)
EGFR (African American): 44 — ABNORMAL LOW
EGFR (Non-African Amer.): 38 — ABNORMAL LOW
GLUCOSE: 103 mg/dL — AB (ref 65–99)
OSMOLALITY: 281 (ref 275–301)
Potassium: 3.7 mmol/L (ref 3.5–5.1)
Sodium: 140 mmol/L (ref 136–145)

## 2013-05-24 LAB — CBC WITH DIFFERENTIAL/PLATELET
BASOS PCT: 1 %
Basophil #: 0.1 10*3/uL (ref 0.0–0.1)
Eosinophil #: 0.3 10*3/uL (ref 0.0–0.7)
Eosinophil %: 4.3 %
HCT: 31.7 % — ABNORMAL LOW (ref 35.0–47.0)
HGB: 10.5 g/dL — AB (ref 12.0–16.0)
LYMPHS ABS: 2.3 10*3/uL (ref 1.0–3.6)
Lymphocyte %: 34.4 %
MCH: 29.8 pg (ref 26.0–34.0)
MCHC: 33 g/dL (ref 32.0–36.0)
MCV: 90 fL (ref 80–100)
Monocyte #: 0.4 x10 3/mm (ref 0.2–0.9)
Monocyte %: 6.2 %
Neutrophil #: 3.7 10*3/uL (ref 1.4–6.5)
Neutrophil %: 54.1 %
Platelet: 184 10*3/uL (ref 150–440)
RBC: 3.51 10*6/uL — AB (ref 3.80–5.20)
RDW: 16.1 % — AB (ref 11.5–14.5)
WBC: 6.8 10*3/uL (ref 3.6–11.0)

## 2013-05-24 LAB — TSH: THYROID STIMULATING HORM: 1.19 u[IU]/mL

## 2013-05-24 LAB — LIPID PANEL
CHOLESTEROL: 157 mg/dL (ref 0–200)
HDL Cholesterol: 30 mg/dL — ABNORMAL LOW (ref 40–60)
Ldl Cholesterol, Calc: 95 mg/dL (ref 0–100)
Triglycerides: 159 mg/dL (ref 0–200)
VLDL Cholesterol, Calc: 32 mg/dL (ref 5–40)

## 2013-05-24 LAB — MAGNESIUM: Magnesium: 1.8 mg/dL

## 2013-06-18 ENCOUNTER — Encounter: Payer: Self-pay | Admitting: *Deleted

## 2013-06-20 ENCOUNTER — Ambulatory Visit (INDEPENDENT_AMBULATORY_CARE_PROVIDER_SITE_OTHER): Payer: Medicare Other | Admitting: Neurology

## 2013-06-20 ENCOUNTER — Encounter: Payer: Self-pay | Admitting: Neurology

## 2013-06-20 ENCOUNTER — Encounter (INDEPENDENT_AMBULATORY_CARE_PROVIDER_SITE_OTHER): Payer: Self-pay

## 2013-06-20 VITALS — BP 108/73 | HR 63 | Resp 16 | Ht 66.5 in | Wt 188.0 lb

## 2013-06-20 DIAGNOSIS — I10 Essential (primary) hypertension: Secondary | ICD-10-CM

## 2013-06-20 DIAGNOSIS — R0609 Other forms of dyspnea: Secondary | ICD-10-CM

## 2013-06-20 DIAGNOSIS — I119 Hypertensive heart disease without heart failure: Secondary | ICD-10-CM | POA: Insufficient documentation

## 2013-06-20 DIAGNOSIS — G4710 Hypersomnia, unspecified: Secondary | ICD-10-CM | POA: Insufficient documentation

## 2013-06-20 DIAGNOSIS — R0989 Other specified symptoms and signs involving the circulatory and respiratory systems: Secondary | ICD-10-CM

## 2013-06-20 DIAGNOSIS — R0683 Snoring: Secondary | ICD-10-CM

## 2013-06-20 DIAGNOSIS — R351 Nocturia: Secondary | ICD-10-CM

## 2013-06-20 NOTE — Progress Notes (Signed)
[facility]  Provider:  Larey Seat, M D  Referring Provider: Marcello Fennel, MD Primary Care Physician:  Marton Redwood, MD  Chief Complaint  Patient presents with  . New Evaluation    Room 10  . Sleep consult    HPI:  Carolyn Swanson is a 68 y.o. left handed , caucasian , married  female , who is  seen here as a referral from Dr. Carmie Kanner for a sleep evaluation,  after her blood pressure remained elevated on multiple medications. The patient presented to the local ED with a BP of 220 mmHg over 160. 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 Once the BP was controlled , all symptoms resolved.   She denies having sleep apnea -Her husband has witnessed her to snore, but is unsure about apnea events.  By 10 PM the patient goes to bed about 10 PM and reads in bed, she will go to sleep within 30-60 minutes, she considers thi normal and insists that falling asleep is not a struggle .She will sleep for 4-5 hours , spontaneously, and uses the wake period for a nocturia break ,she will go up to 3 times- depending on her fluid intake. She is retired , and no longer needs an alarm, she spontaneously at about 6.30 , the same time she rose for work in the past. If she has nothing planned, she may sleep in and return to bed - for another hour or two.  She does avoid caffeinated beverages but admits to one can cola a day in the morning, decaffeinated iced tea at dinner.  She has no ankle edema.   The patient feels sleepy (Epworth 10, FSS of  34, GDS 0 points ) if she is relaxed and has a background noise, such as the TV. These naps occur after lunch in the afternoon and last 15-30 minutes , she calls these " power napping'. She feels refreshed afterwards. She gained weight after the birth of her children and she has tried any diet , but always Yo-Yo-ed back or gained in the end. The patient lost a  significant amount of weight during a hospitalization ( 25 pounds) after knee surgery on January 19 th 2015  and the development gouty arthritis.         Review of Systems: Out of a complete 14 system review, the patient complains of only the following symptoms, and all other reviewed systems are negative. Epworth 10, FSS 34, GDS zero.  History   Social History  . Marital Status: Married    Spouse Name: [name]    Number of Children: 2  . Years of Education: College   Occupational History  . Not on file.   Social History Main Topics  . Smoking status: Former Research scientist (life sciences)  . Smokeless tobacco: Never Used  . Alcohol Use: No  . Drug Use: No  . Sexual Activity: Not on file   Other Topics Concern  . Not on file   Social History Narrative   Patient is married ([name]).   Patient has two children.   Patient has a college education.   Patient is left-handed.   Patient drinks one Diet coke per day.             Family History  Problem Relation Age of Onset  . Breast cancer Mother   . Nephrolithiasis Son   . Diabetes Son   .  Lung cancer Father   . Alcoholism Father     Past Medical History  Diagnosis Date  . Complication of anesthesia     "I was crazy for about 4 days after surgery"  . Hypertension   . Hyperlipidemia   . Arthritis   . History of nephritis     as child - no residual problems  . Anxiety   . Gout 01/15  . Obesity     Past Surgical History  Procedure Laterality Date  . Joint replacement  2013    LEFT TOTAL KNEE  . Abdominal hysterectomy  1990  . Cholecystectomy  2010  . Total knee arthroplasty Right 02/04/2013    Procedure: RIGHT TOTAL KNEE ARTHROPLASTY;  Surgeon: Gearlean Alf, MD;  Location: [facility]S;  Service: Orthopedics;  Laterality: Right;    Current Outpatient Prescriptions  Medication Sig Dispense Refill  . acetaminophen (TYLENOL) 325 MG tablet Take 2 tablets (650 mg total) by mouth every 6 (six) hours as needed for mild pain (or  Fever >/= 101).  40 tablet  0  . allopurinol (ZYLOPRIM) 300 MG tablet 1 tablet daily.      Marland Kitchen amLODipine (NORVASC) 5 MG tablet 1 tablet daily.      Marland Kitchen amoxicillin (AMOXIL) 500 MG capsule As needed for dental      . atorvastatin (LIPITOR) 40 MG tablet Take 1 tablet (40 mg total) by mouth at bedtime.  30 tablet  0  . bisacodyl (DULCOLAX) 10 MG suppository Place 1 suppository (10 mg total) rectally daily as needed for moderate constipation.  12 suppository  0  . carvedilol (COREG) 12.5 MG tablet Take 1 tablet (12.5 mg total) by mouth 2 (two) times daily with a meal.  60 tablet  0  . clopidogrel (PLAVIX) 75 MG tablet 1 tablet daily.      . colchicine 0.6 MG tablet 1 tablet daily.      . enalapril (VASOTEC) 20 MG tablet 1 tablet daily.      Marland Kitchen lisinopril (PRINIVIL,ZESTRIL) 20 MG tablet 1 tablet daily.      . meloxicam (MOBIC) 15 MG tablet 1 tablet. As needed for pain      . metoprolol succinate (TOPROL-XL) 50 MG 24 hr tablet 1 tablet daily.      Marland Kitchen venlafaxine XR (EFFEXOR-XR) 75 MG 24 hr capsule Take 1 capsule (75 mg total) by mouth at bedtime.  30 capsule  0   No current facility-administered medications for this visit.    Allergies as of 06/20/2013  . (No Known Allergies)    Vitals: BP 108/73  Pulse 63  Resp 16  Ht 5' 6.5" (1.689 m)  Wt 188 lb (85.276 kg)  BMI 29.89 kg/m2 Last Weight:  Wt Readings from Last 1 Encounters:  06/20/13 188 lb (85.276 kg)   Last Height:   Ht Readings from Last 1 Encounters:  06/20/13 5' 6.5" (1.689 m)    Physical exam:  General: The patient is awake, alert and appears not in acute distress. The patient is well groomed. Head: Normocephalic, atraumatic. Neck is supple. Mallampati 3 , neck circumference:15.5 inches.  Cardiovascular:  Regular rate and rhythm , without  murmurs or carotid bruit, and without distended neck veins. Respiratory: Lungs are clear to auscultation. Skin:  Without evidence of edema, or rash Trunk: BMI is  elevated and patient  has  normal posture.  Neurologic exam : The patient is awake and alert, oriented to place and time.  Memory subjective  described as intact.  There is a normal attention span & concentration ability. Speech is fluent without dysarthria, dysphonia or aphasia.  Mood and affect are appropriate.  Cranial nerves: Pupils are equal and briskly reactive to light. Funduscopic exam without   evidence of pallor or edema. Extraocular movements  in vertical and horizontal planes intact and without nystagmus. Visual fields by finger perimetry are intact. Hearing to finger rub intact.  Facial sensation intact to fine touch.  Facial motor strength is symmetric and tongue and uvula move midline.  Motor exam:  Normal tone , muscle bulk and symmetric normal strength in all extremities.  Sensory:  Fine touch, pinprick and vibration were tested in all extremities. Proprioception is tested in the upper extremities only. This was normal.  Coordination: Rapid alternating movements in the fingers/hands is tested and normal.  Finger-to-nose maneuver tested and normal without evidence of ataxia, dysmetria or tremor.  Gait and station: Patient walks without assistive device . Strength within normal limits. Stance is stable and normal. Tandem gait is unfragmented. Romberg testing is normal.  Deep tendon reflexes: in the  upper and lower extremities are symmetric and intact. Babinski maneuver  downgoing.   Assessment:  After physical and neurologic examination, review of laboratory studies, imaging, neurophysiology testing and pre-existing records, assessment is   1) Snoring, overweight, fatigued. No retrognathia but larger neck size. Mallampati.  2) nocturia.    Plan:  Treatment plan and additional workup : SPLIT study , dental device versus CPAP approach discussed, pending diagnosis,  CO2 not needed.

## 2013-06-20 NOTE — Patient Instructions (Signed)
Sleep Apnea  Sleep apnea is disorder that affects a person's sleep. A person with sleep apnea has abnormal pauses in their breathing when they sleep. It is hard for them to get a good sleep. This makes a person tired during the day. It also can lead to other physical problems. There are three types of sleep apnea. One type is when breathing stops for a short time because your airway is blocked (obstructive sleep apnea). Another type is when the brain sometimes fails to give the normal signal to breathe to the muscles that control your breathing (central sleep apnea). The third type is a combination of the other two types.  HOME CARE  · Do not sleep on your back. Try to sleep on your side.  · Take all medicine as told by your doctor.  · Avoid alcohol, calming medicines (sedatives), and depressant drugs.  · Try to lose weight if you are overweight. Talk to your doctor about a healthy weight goal.  Your doctor may have you use a device that helps to open your airway. It can help you get the air that you need. It is called a positive airway pressure (PAP) device. There are three types of PAP devices:  · Continuous positive airway pressure (CPAP) device.  · Nasal expiratory positive airway pressure (EPAP) device.  · Bilevel positive airway pressure (BPAP) device.  MAKE SURE YOU:  · Understand these instructions.  · Will watch your condition.  · Will get help right away if you are not doing well or get worse.  Document Released: 10/13/2007 Document Revised: 12/21/2011 Document Reviewed: 05/07/2011  Exit[employer]® Patient Information ©2014 [employer].

## 2013-08-29 ENCOUNTER — Encounter: Payer: Self-pay | Admitting: Neurology

## 2013-08-29 ENCOUNTER — Ambulatory Visit (INDEPENDENT_AMBULATORY_CARE_PROVIDER_SITE_OTHER): Payer: Medicare Other | Admitting: Neurology

## 2013-08-29 DIAGNOSIS — G4710 Hypersomnia, unspecified: Secondary | ICD-10-CM

## 2013-08-29 DIAGNOSIS — R0683 Snoring: Secondary | ICD-10-CM

## 2013-08-29 DIAGNOSIS — G4733 Obstructive sleep apnea (adult) (pediatric): Secondary | ICD-10-CM

## 2013-08-29 DIAGNOSIS — R351 Nocturia: Secondary | ICD-10-CM

## 2013-08-29 DIAGNOSIS — R0902 Hypoxemia: Secondary | ICD-10-CM

## 2013-08-29 DIAGNOSIS — I10 Essential (primary) hypertension: Secondary | ICD-10-CM

## 2013-09-16 ENCOUNTER — Other Ambulatory Visit: Payer: Self-pay | Admitting: Neurology

## 2013-09-16 ENCOUNTER — Telehealth: Payer: Self-pay | Admitting: Neurology

## 2013-09-16 DIAGNOSIS — G4733 Obstructive sleep apnea (adult) (pediatric): Secondary | ICD-10-CM

## 2013-09-16 NOTE — Telephone Encounter (Signed)
I called the patient and reviewed the results from his sleep study with the patient's spouse.  She is aware that no significant obstructive sleep apnea was found, however the patient's O2 during the sleep study was noted to be low.  Dr. Brett Fairy recommended that the patient can proceed with sleep behavior modification by avoiding supine sleep.   She is also aware that an order for an overnight pulse oximetry test will be ordered to determine if oxygen supplementation may be needed.  A copy of the patient's sleep study report will be faxed to Dr. Redmond School and the patient will receive a copy of his sleep study report via mail.

## 2013-09-20 ENCOUNTER — Ambulatory Visit (INDEPENDENT_AMBULATORY_CARE_PROVIDER_SITE_OTHER): Payer: Medicare Other | Admitting: Neurology

## 2013-09-20 DIAGNOSIS — G4733 Obstructive sleep apnea (adult) (pediatric): Secondary | ICD-10-CM

## 2013-10-07 ENCOUNTER — Telehealth: Payer: Self-pay | Admitting: *Deleted

## 2013-10-07 ENCOUNTER — Other Ambulatory Visit: Payer: Self-pay | Admitting: Neurology

## 2013-10-07 DIAGNOSIS — G4733 Obstructive sleep apnea (adult) (pediatric): Secondary | ICD-10-CM

## 2013-10-07 NOTE — Telephone Encounter (Signed)
Patient was phoned and provided results of her CPAP titration report.  Patient was advised that CPAP therapy was effective in combating her sleep apnea.  Patient was advised that the MD had recommended CPAP therapy in the home setting and that her information would be forwarded to a [place] provider.  Patient had many questions that were addressed and she seems apprehensive about using CPAP.  Patient was advised that a copy would be mailed to her and a copy would be sent to the referring physician, Dr. Marton Redwood

## 2013-10-08 ENCOUNTER — Encounter: Payer: Self-pay | Admitting: Neurology

## 2013-11-22 ENCOUNTER — Encounter: Payer: Self-pay | Admitting: *Deleted

## 2014-01-30 ENCOUNTER — Encounter: Payer: Self-pay | Admitting: Neurology

## 2014-05-10 NOTE — H&P (Signed)
PATIENT NAME:  Carolyn Swanson, Carolyn Swanson MR#:  [id number] DATE OF BIRTH:  1945-08-26  DATE OF ADMISSION:  05/24/2013  PRIMARY CARE PHYSICIAN: Dr. Lang Snow   REFERRING PHYSICIAN: Dr. Joni Fears.   CHIEF COMPLAINT: Right upper extremity weakness.   HISTORY OF PRESENT ILLNESS: The patient is a 69 year old pleasant Caucasian female with past medical history of hypertension, hyperlipidemia, and depression, is presenting to the ER with a chief complaint of intermittent episodes of right hand weakness and poor concentration since yesterday. The patient has first noticed right upper extremity weakness when she was having dinner at a restaurant day before yesterday night. She is also having poor concentration since then. Eventually, the right upper extremity weakness was resolved, but again, she started having the same symptoms yesterday. So, patient has noticed right upper extremity weakness intermittently associated with poor concentration and leaning toward left side while ambulating and also while driving. Denies any speech problems or difficulty in swallowing. Denies any blurry vision, double vision. Complaining of headache and reporting that her right eye is throbbing, but now she is having headache. Denies any dizzy spells or loss of consciousness. Denies any nausea, vomiting, abdominal pain, chest pain, or shortness of breath. No similar complaints in the past. The patient has history of hypertension and is compliant with her medications. As her symptoms are not completely resolved, she is brought into the ER. In the ER, the patient's blood pressure is persistently high. Initial blood pressure 190/86, and at one point, it went up to 215/94. IV Lopressor 10 mg was given following which her blood pressure dropped down to 197/90. 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 The patient was given aspirin and there is no active bleed. Hospitalist team is  called to admit the patient. The patient is resting comfortably during my examination.  The patient's husband ago and childhood friend is next to her. The patient denies any similar complaints in the past.   PAST MEDICAL HISTORY: Hypertension, hyperlipidemia, depression.   PAST SURGICAL HISTORY: Hysterectomy, cholecystectomy, bilateral total knee replacement.   ALLERGIES: No known drug allergies.   PSYCHOSOCIAL HISTORY: Lives at home with husband and children.  Denies any history of smoking, alcohol, or illicit drug usage.   FAMILY HISTORY: Mother had a history of breast cancer, died of peptic ulcer disease and stroke.  HOME MEDICATIONS: Enalapril 10 mg once daily, Effexor dose unknown, cholesterol tablet dose unknown, aspirin 325 mg once daily.   REVIEW OF SYSTEMS:  CONSTITUTIONAL: Denies any fever or fatigue. Complaining of weakness.  EYES: Denies blurry vision, double vision, glaucoma, cataracts.  EARS, NOSE, AND THROAT: Denies epistaxis, discharge, ear pain, hearing loss.  RESPIRATION: Denies cough, COPD.  CARDIOVASCULAR: No chest pain, palpitations, syncope.  GASTROINTESTINAL: Denies nausea, vomiting, diarrhea, abdominal pain, hematemesis.  GENITOURINARY: No dysuria, hematuria, renal calculi. GYNECOLOGICAL/BREASTS: Denies breast mass or vaginal discharge. Marland Kitchen ENDOCRINE:  Denies polyuria, nocturia, thyroid problems.  Denies any increased sweating. HEMATOLOGIC AND LYMPHATIC: No anemia, easy bruising, bleeding.  INTEGUMENTARY: No acne, rash, lesions.  MUSCULOSKELETAL: No joint pain in the neck and back. Denies gout. Denies any redness.  NEUROLOGIC: Denies vertigo, ataxia. No past medical history of strokes or mini strokes.  Denies any seizures.  PSYCHIATRIC: No ADD, OCD, insomnia, anxiety. Has history of depression.   PHYSICAL EXAMINATION:  VITAL SIGNS: Temperature 98.3, pulse 72, respirations 18, blood pressure 180/84, pulse oximetry 98% on room air.  GENERAL APPEARANCE: Not under  acute  distress. Moderately built and nourished.  HEENT: Normocephalic, atraumatic. Pupils are equally reacting to light and accommodation. No scleral icterus. No conjunctival injection. No sinus tenderness. No postnasal drip. Moist mucous membranes. No sinus tenderness.  NECK: Supple. No JVD. No thyromegaly. Range of motion is intact.   LUNGS: Clear to auscultation bilaterally. No accessory muscle use and no anterior chest wall tenderness on palpation.  CARDIAC: S1, S2 normal. Regular rate and rhythm. No murmurs.  GASTROINTESTINAL: Soft. Bowel sounds are positive in all 4 quadrants. Nontender, nondistended. No hepatosplenomegaly. No masses felt.  NEUROLOGIC: Awake, alert, oriented x 3. Cranial nerves II through XII are grossly intact. No cerebellar signs. Motor and sensory are intact. Reflexes are 2+.  No pronator drift. EXTREMITIES: No edema. No cyanosis. No clubbing.  SKIN: Warm to touch. Normal turgor. No rashes. No lesions.  MUSCULOSKELETAL: No joint effusion, tenderness, or erythema.   PSYCHIATRIC: Normal mood and affect.   LABORATORIES AND IMAGING STUDIES: Chest x-ray, portable, single view, no active disease. A CAT scan of the head that without contrast has revealed age indeterminate left chronic lacunar infarction. Recommended MRI of the brain if there is a clinical concern regarding the chronicity of the infarction. Accu-Chek 115, glucose 117, BUN 19, creatinine 1.02, sodium 137, potassium 3.8, chloride normal, CO2 are normal, GFR 45. Anion gap is 6, serum osmolality and calcium are normal. LFTs: Total protein 8.5. Albumin is normal. Total bilirubin is normal. Alkaline phosphatase at 195, AST and ALT are normal. Troponin less than 0.02. CBC: WBC 6.4, hemoglobin is 12.1, hematocrit is 36.4, platelets 221,000.  AST is elevated at 49, MCV 89, PT 16.0, INR 1.3, activated PTT 28.6. EKG: Normal sinus rhythm at 70 beats per minute, normal PR and QRS interval. No acute ST-T wave changes. Low-voltage  EKG.  ASSESSMENT AND PLAN: A 69 year old Caucasian female presenting to the ER with a chief complaint of right upper extremity weakness associated with a headache for a day with waxing and waning of symptoms.   ASSESSMENT AND PLAN:  1. Transient ischemic attack versus cerebrovascular accident. We will admit her to telemetry. We will get stroke workup with MRI of the brain, carotid Dopplers, and 2-D echocardiogram. We will get neuro checks. We will provide aspirin and statin. PT consult is placed. Bedside swallow evaluation by nursing. If there is no swallowing difficulty, we will put her on an AHA diet.  2. Malignant hypertension. We will resume her home medication and increase the dose to 20 mg. Amlodipine 5 mg added to the regimen. Also, beta blocker, metoprolol is added to the regimen at 25 mg p.o. b.i.d. We will monitor her blood pressure and up titrate medications.  3. We will allow permissive hypertension in view of the concern about a stroke or mini stroke.  4. Hyperlipidemia. Continue statin. Check fasting lipid panel.  5. Chronic history of depression. The patient denies any suicidal or homicidal ideation at this time. We will monitor her closely.  6. We will provide gastrointestinal and deep vein thrombosis prophylaxis.   CODE STATUS: She is full code. Daughter, son, and husband are medical power of attorney. Diagnosis and plan of care was discussed in detail with the patient and her husband at bedside. They both verbalized understanding of the plan.   TOTAL TIME SPENT ON ADMISSION:  50 minutes.    ____________________________ Nicholes Mango, MD ag:dd/am D: 05/24/2013 01:26:00 ET T: 05/24/2013 03:13:32 ET JOB#: [id number]  cc: Nicholes Mango, MD, <Dictator> Dr. Willette Brace MD ELECTRONICALLY  SIGNED 05/26/2013 4:39

## 2014-05-10 NOTE — Discharge Summary (Signed)
Dates of Admission and Diagnosis:  Date of Admission 24-May-2013   Date of Discharge 24-May-2013   Admitting Diagnosis TIA   Final Diagnosis TIA- MRI and Carotid doppler negative for any significant findings. symptoms resolved. 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 increased meds. Hyperlipidemia    Chief Complaint/History of Present Illness patient is a 69 year old pleasant Caucasian female with past medical history of hypertension, hyperlipidemia, and depression, is presenting to the ER with a chief complaint of intermittent episodes of right hand weakness and poor concentration since yesterday. The patient has first noticed right upper extremity weakness when she was having dinner at a restaurant day before yesterday night. She is also having poor concentration since then. Eventually, the right upper extremity weakness was resolved, but again, she started having the same symptoms yesterday. So, patient has noticed right upper extremity weakness intermittently associated with poor concentration and leaning toward left side while ambulating and also while driving. Denies any speech problems or difficulty in swallowing. Denies any blurry vision, double vision. Complaining of headache and reporting that her right eye is throbbing, but now she is having headache. Denies any dizzy spells or loss of consciousness. Denies any nausea, vomiting, abdominal pain, chest pain, or shortness of breath. No similar complaints in the past. The patient has history of hypertension and is compliant with her medications. As her symptoms are not completely resolved, she is brought into the ER. In the ER, the patient's blood pressure is persistently high. Initial blood pressure 190/86, and at one point, it went up to 215/94. IV Lopressor 10 mg was given following which her blood pressure dropped down to 197/90. 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 The  patient was given aspirin and there is no active bleed. Hospitalist team is called to admit the patient. The patient is resting comfortably during my examination.  The patient's husband ago and childhood friend is next to her. The patient denies any similar complaints in the past.   Allergies:  No Known Allergies:   Thyroid:  08-May-15 05:00   Thyroid Stimulating Hormone 1.19 (0.45-4.50 (International Unit)  ----------------------- Pregnant patients have  different reference  ranges for TSH:  - - - - - - - - - -  Pregnant, first trimetser:  0.36 - 2.50 uIU/mL)  Hepatic:  07-May-15 21:46   Bilirubin, Total 0.7  Alkaline Phosphatase  195 (45-117 NOTE: New Reference Range 12/07/12)  SGPT (ALT) 35  SGOT (AST) 29  Total Protein, Serum  8.5  Albumin, Serum 4.1  Cardiology:  08-May-15 09:03   Echo Doppler REASON FOR EXAM:     COMMENTS:     PROCEDURE: [facility] - ECHO DOPPLER COMPLETE(TRANSTHOR)  - May 24 2013  9:03AM   RESULT: Echocardiogram Report  Patient Name:   TERRIONA HORLACHER [facility] Date of Exam: 05/24/2013 Medical Rec #:  [id number]            Custom1: Date of Birth:  Jun 26, 1945         Height:       67.0 in Patient Age:    89 years          Weight:       185.0 lb Patient Gender: F                 BSA:          1.96 m??  Indications: CVA Sonographer:    Janalee Dane RCS Referring Phys:  GOURU,ARUNA  Summary:  1. Left ventricular ejection fraction, by visual estimation, is 55 to  60%.  2. Normal global left ventricular systolic function.  3. Mild mitral valve regurgitation.  4. Mild tricuspid regurgitation. 2D AND M-MODE MEASUREMENTS (normal ranges within parentheses): Left Ventricle:          Normal IVSd (2D):      1.11 cm (0.7-1.1) LVPWd (2D):     1.01 cm (0.7-1.1) Aorta/LA:                  Normal LVIDd (2D):     3.86 cm (3.4-5.7) Aortic Root (2D): 3.00 cm (2.4-3.7) LVIDs (2D):     1.51 cm           Left Atrium (2D): 3.80 cm (1.9-4.0) LV FS (2D):     60.9 %   (>25%) LV EF  (2D):     90.4 %   (>50%)                                   Right Ventricle:                                   RVd (2D):        3.29 cm LV DIASTOLIC FUNCTION: MV Peak E: 0.97 m/s Decel Time: 299 msec MV Peak A: 1.10 m/s E/A Ratio: 0.88 SPECTRAL DOPPLER ANALYSIS (where applicable): Mitral Valve: MV P1/2 Time: 86.71 msec MV Area, PHT: 2.54 cm?? Tricuspid Valve and PA/RV Systolic Pressure: TR Max Velocity: 2.04 m/s RA  Pressure: 5 mmHg RVSP/PASP: 21.6 mmHg  PHYSICIAN INTERPRETATION: Left Ventricle: The left ventricular internal cavity size was normal. LV  posterior wall thickness was normal. Global LV systolic function was  normal. Left ventricular ejection fraction, by visual estimation, is 55  to 60%. Right Ventricle: Normal right ventricular size, wall thickness, and  systolic function. RV wall thickness is normal. Left Atrium: The left atrium is normal in size andstructure. Right Atrium: The right atrium is normal in size and structure. Pericardium: There is no evidence of pericardial effusion. Mitral Valve: Mild mitral valve regurgitation is seen. Tricuspid Valve: Mild tricuspid regurgitation is visualized. The  tricuspid regurgitant velocity is 2.04 m/s, and with an assumed right  atrial pressure of 5 mmHg, the estimated right ventricular systolic  pressure is normal at 21.6 mmHg. Aortic Valve: The aortic valve is trileaflet and structurally normal,   with normal leaflet excursion; without any evidence of aortic stenosis or  insufficiency. Pulmonic Valve: Structurally normal pulmonic valve, with normal leaflet  excursion. Aorta: The aortic root and ascending aorta are structurally normal, with  no evidence of dilitation. Shunts: There is no evidence of a patent foramen ovale. There is no  evidence of an atrial septal defect.  [place] MD Electronically signed by [id number] Serafina Royals MD Signature Date/Time: 05/24/2013/4:06:29 PM  *** Final *** IMPRESSION:  .    Verified By: Corey Skains  ([facility]), M.D., MD  Routine Chem:  07-May-15 21:46   Glucose, Serum  117  BUN  19  Creatinine (comp) 1.22  Sodium, Serum 137  Potassium, Serum 3.8  Chloride, Serum 101  CO2, Serum 30  Calcium (Total), Serum 9.5  Anion Gap  6  Osmolality (calc) 277  eGFR (African American)  52  eGFR (Non-African American)  45 (eGFR values <34m/min/1.73 m2  may be an indication of chronic kidney disease (CKD). Calculated eGFR is useful in patients with stable renal function. The eGFR calculation will not be reliable in acutely ill patients when serum creatinine is changing rapidly. It is not useful in  patients on dialysis. The eGFR calculation may not be applicable to patients at the low and high extremes of body sizes, pregnant women, and vegetarians.)  08-May-15 05:00   Cholesterol, Serum 157  Triglycerides, Serum 159  HDL (INHOUSE)  30  VLDL Cholesterol Calculated 32  LDL Cholesterol Calculated 95 (Result(s) reported on 24 May 2013 at 05:37AM.)  Glucose, Serum  103  BUN 17  Creatinine (comp)  1.41  Sodium, Serum 140  Potassium, Serum 3.7  Chloride, Serum  108  CO2, Serum 27  Calcium (Total), Serum 8.8  Anion Gap  5  Osmolality (calc) 281  eGFR (African American)  44  eGFR (Non-African American)  38 (eGFR values <76m/min/1.73 m2 may be an indication of chronic kidney disease (CKD). Calculated eGFR is useful in patients with stable renal function. The eGFR calculation will not be reliable in acutely ill patients when serum creatinine is changing rapidly. It is not useful in  patients on dialysis. The eGFR calculation may not be applicable to patients at the low and high extremes of body sizes, pregnant women, and vegetarians.)  Magnesium, Serum 1.8 (1.8-2.4 THERAPEUTIC RANGE: 4-7 mg/dL TOXIC: > 10 mg/dL  -----------------------)  Cardiac:  07-May-15 21:46   Troponin I < 0.02 (0.00-0.05 0.05 ng/mL or less: NEGATIVE  Repeat testing in 3-6  hrs  if clinically indicated. >0.05 ng/mL: POTENTIAL  MYOCARDIAL INJURY. Repeat  testing in 3-6 hrs if  clinically indicated. NOTE: An increase or decrease  of 30% or more on serial  testing suggests a  clinically important change)  Routine Coag:  07-May-15 21:46   Activated PTT (APTT) 28.6 (A HCT value >55% may artifactually increase the APTT. In one study, the increase was an average of 19%. Reference: "Effect on Routine and Special Coagulation Testing Values of Citrate Anticoagulant Adjustment in Patients with High HCT Values." American Journal of Clinical Pathology 2006;126:400-405.)  Prothrombin  16.0  INR 1.3 (INR reference interval applies to patients on anticoagulant therapy. A single INR therapeutic range for coumarins is not optimal for all indications; however, the suggested range for most indications is 2.0 - 3.0. Exceptions to the INR Reference Range may include: Prosthetic heart valves, acute myocardial infarction, prevention of myocardial infarction, and combinations of aspirin and anticoagulant. The need for a higher or lower target INR must be assessed individually. Reference: The Pharmacology and Management of the Vitamin K  antagonists: the seventh ACCP Conference on Antithrombotic and Thrombolytic Therapy. [id number] (3suppl): [id number] A HCT value >55% may artifactually increase the PT.  In one study,  the increase was an average of 25%. Reference:  "Effect on Routine and Special Coagulation Testing Values of Citrate Anticoagulant Adjustment in Patients with High HCT Values." American Journal of Clinical Pathology 2006;126:400-405.)  Routine Hem:  07-May-15 21:46   WBC (CBC) 6.4  RBC (CBC) 4.07  Hemoglobin (CBC) 12.1  Hematocrit (CBC) 36.4  Platelet Count (CBC) 221 (Result(s) reported on 23 May 2013 at 10:25PM.)  MCV 89  MCH 29.6  MCHC 33.1  RDW  16.4  Erythrocyte Sed Rate  49 (Result(s) reported on 23 May 2013 at 10:47PM.)  08-May-15 05:00    WBC (CBC) 6.8  RBC (CBC)  3.51  Hemoglobin (CBC)  10.5  Hematocrit (CBC)  31.7  Platelet Count (CBC) 184  MCV 90  MCH 29.8  MCHC 33.0  RDW  16.1  Neutrophil % 54.1  Lymphocyte % 34.4  Monocyte % 6.2  Eosinophil % 4.3  Basophil % 1.0  Neutrophil # 3.7  Lymphocyte # 2.3  Monocyte # 0.4  Eosinophil # 0.3  Basophil # 0.1 (Result(s) reported on 24 May 2013 at 05:30AM.)   PERTINENT RADIOLOGY STUDIES: [place]:    08-May-15 11:47, US Carotid Doppler Bilateral  US Carotid Doppler Bilateral   REASON FOR EXAM:    CVA  COMMENTS:       PROCEDURE: [place]  - US CAROTID DOPPLER BILATERAL  - May 24 2013 11:47AM     CLINICAL DATA:  Hypertension, syncope, hyperlipidemia, stroke.    EXAM:  BILATERAL CAROTID DUPLEX ULTRASOUND    TECHNIQUE:  Pearline Cables scale imaging, color Doppler and duplex ultrasound was  performed of bilateral carotid and vertebral arteries in the neck.    COMPARISON:  01/20/2009  REVIEW OF SYSTEMS:  Quantification of carotid stenosis is based on velocity parameters  that correlate the residual internal carotid diameter with  NASCET-based stenosis levels, using the diameter of the distal  internal carotid lumen as the denominator for stenosis measurement.    The following velocity measurements were obtained:    PEAK SYSTOLIC/END DIASTOLIC    RIGHT    ICA:                     93/26cm/sec    CCA:                     970/26VZ/CHY  SYSTOLIC ICA/CCA RATIO:  0.9    DIASTOLIC ICA/CCA RATIO: 2.1    ECA:                     107cm/sec    LEFT    ICA:                     82/18cm/sec    CCA:                     85/02DX/AJO    SYSTOLIC ICA/CCA RATIO:  0.8    DIASTOLIC ICA/CCA RATIO: 1.1  ECA:                     93cm/sec    FINDINGS:  RIGHT CAROTID ARTERY: Mild intimal thickening but no focal plaque  accumulation or stenosis. Normal waveforms and color Doppler signal.  Distal ICA tortuous.    RIGHT VERTEBRAL ARTERY:  Normal flow direction and waveform.    LEFT  CAROTID ARTERY: Mildly tortuous. No significant plaque  accumulation or stenosis. Normal waveforms color Doppler signal.    LEFT VERTEBRAL ARTERY: Normal flow direction and waveform.     IMPRESSION:  No significant plaque or stenosis.      Electronically Signed    By: Arne Cleveland M.D.    On: 05/24/2013 11:58         Verified By: Kandis Cocking, M.D.,  LabUnknown:    07-May-15 21:24, CT Head Without Contrast  PACS Image     08-May-15 10:47, MRI Brain Without Contrast  PACS Image     08-May-15 11:47, US Carotid Doppler Bilateral  PACS Image   MRI:    08-May-15 10:47, MRI Brain Without Contrast  MRI Brain Without Contrast   REASON FOR EXAM:    CVA  COMMENTS:  PROCEDURE: MR  - MR BRAIN WO CONTRAST  - May 24 2013 10:47AM     CLINICAL DATA:  Pain behind right eye.  Right hand weakness.    EXAM:  MRI HEAD WITHOUT CONTRAST    TECHNIQUE:  Multiplanar, multiecho pulse sequences of the brain and surrounding  structures were obtained without intravenous contrast.    COMPARISON:  CT 05/23/2013  FINDINGS:  Ventricle size is normal. Pituitary normal in size. Craniocervical  junction normal.    Cerebral volume normal for age. Mild chronic microvascular ischemic  changes in the white matter and pons.    Negative for acute infarct. Negative for hemorrhage or mass. Dilated  perivascular spaces in the basal ganglia bilaterally.     IMPRESSION:  Chronic microvascular ischemia.  No acute abnormality.      Electronically Signed    By: Franchot Gallo M.D.    On: 05/24/2013 10:55         Verified By: Truett Perna, M.D.,  CT:    07-May-15 21:24, CT Head Without Contrast  CT Head Without Contrast   REASON FOR EXAM:    Stroke like symptoms  COMMENTS:       PROCEDURE: CT  - CT HEAD WITHOUT CONTRAST  - May 23 2013  9:24PM     CLINICAL DATA:  Pain behind the right eye    EXAM:  CT HEAD WITHOUT CONTRAST    TECHNIQUE:  Contiguous axial images were obtained from  the base of the skull  through the vertex without intravenous contrast.    COMPARISON:  US CAROTID DUPLEX BILAT dated 01/20/2009; MR HEAD WO/W CM  dated 01/20/2009    FINDINGS:  There is no evidence of mass effect, midline shift, or extra-axial  fluid collections. There is no evidence of a space-occupying lesion  or intracranial hemorrhage. There is no evidence of a cortical-based  area of acute infarction. There is a small hypodensity in the left  thalamus concerning for an age indeterminate lacunar infarct. There  is periventricular white matter low attenuation likely secondary to  microangiopathy.    The ventricles and sulci are appropriate for the patient's age. The  basal cisterns are patent.    Visualized portions of the orbits are unremarkable. The visualized  portions of the paranasal sinuses and mastoid air cells are  unremarkable. Cerebrovascular atherosclerotic calcifications are  noted.    The osseous structures are unremarkable.     IMPRESSION:  1. Age indeterminate left thalamic lacunar infarct. Recommend MRI of  the brain if there is clinical concern regarding the chronicity of  the infarct.      Electronically Signed    By: Kathreen Devoid    On: 05/23/2013 21:31     Verified By: Jennette Banker, M.D., MD   Pertinent Past History:  Pertinent Past History Hypertension, hyperlipidemia, depression.   Hospital Course:  Hospital Course * Transient ischemic attack   MRi and Carotid doppler studies are negative- pt is not feeling any problems now.   Was taking ASA swich to plavix and discharge home.  * Malignant hypertension.   may be it was effect of TIA.    Added amlodipin and metoprolol- better controlled now.  * Hyperlipidemia. Continue statin.   * Chronic history of depression. The patient denies any suicidal or homicidal ideation at this time. We will monitor her closely.   Condition on Discharge Stable   DISCHARGE INSTRUCTIONS HOME MEDS:  Medication  Reconciliation: Patient's Home Medications at Discharge:  Medication Instructions  enalapril 20 mg oral tablet  1 tab(s) orally once a day   atorvastatin 20 mg oral tablet  1 tab(s) orally once a day   plavix 75 mg oral tablet  1 tab(s) orally once a day   metoprolol tartrate 25 mg oral tablet  1 tab(s) orally 2 times a day   amlodipine 5 mg oral tablet  1 tab(s) orally once a day   effexor  1 tab(s) orally once a day    STOP TAKING THE FOLLOWING MEDICATION(S):    cholesterol tablet aspirin 325 mg oral tablet: 1  orally    Physician's Instructions:  Diet Low Sodium  Low Fat, Low Cholesterol   Activity Limitations As tolerated   Return to Work Not Applicable   Time frame for Follow Up Appointment 1-2 weeks  with PMD   Other Comments follow with PMD in 1 week.   Electronic Signatures: Vaughan Basta (MD)  (Signed 11-May-15 23:09)  Authored: ADMISSION DATE AND DIAGNOSIS, CHIEF COMPLAINT/HPI, Allergies, PERTINENT LABS, PERTINENT RADIOLOGY STUDIES, PERTINENT PAST HISTORY, HOSPITAL COURSE, DISCHARGE INSTRUCTIONS HOME MEDS, PATIENT INSTRUCTIONS   Last Updated: 11-May-15 23:09 by Vaughan Basta (MD)

## 2014-09-02 ENCOUNTER — Encounter: Payer: Self-pay | Admitting: *Deleted

## 2014-09-03 ENCOUNTER — Ambulatory Visit: Payer: Medicare Other | Admitting: Anesthesiology

## 2014-09-03 ENCOUNTER — Encounter
Admission: RE | Disposition: A | Discharge status: Home / Self Care | Payer: Self-pay | Source: Ambulatory Visit | Attending: Unknown Physician Specialty

## 2014-09-03 ENCOUNTER — Ambulatory Visit
Admission: RE | Admit: 2014-09-03 | Discharge: 2014-09-03 | Disposition: A | Discharge status: Home / Self Care | Payer: Medicare Other | Source: Ambulatory Visit | Attending: Unknown Physician Specialty | Admitting: Unknown Physician Specialty

## 2014-09-03 ENCOUNTER — Encounter: Payer: Self-pay | Admitting: *Deleted

## 2014-09-03 DIAGNOSIS — F419 Anxiety disorder, unspecified: Secondary | ICD-10-CM | POA: Diagnosis not present

## 2014-09-03 DIAGNOSIS — M109 Gout, unspecified: Secondary | ICD-10-CM | POA: Insufficient documentation

## 2014-09-03 DIAGNOSIS — E785 Hyperlipidemia, unspecified: Secondary | ICD-10-CM | POA: Diagnosis not present

## 2014-09-03 DIAGNOSIS — E669 Obesity, unspecified: Secondary | ICD-10-CM | POA: Insufficient documentation

## 2014-09-03 DIAGNOSIS — Z79899 Other long term (current) drug therapy: Secondary | ICD-10-CM | POA: Diagnosis not present

## 2014-09-03 DIAGNOSIS — I10 Essential (primary) hypertension: Secondary | ICD-10-CM | POA: Insufficient documentation

## 2014-09-03 DIAGNOSIS — M1990 Unspecified osteoarthritis, unspecified site: Secondary | ICD-10-CM | POA: Diagnosis not present

## 2014-09-03 DIAGNOSIS — Z96652 Presence of left artificial knee joint: Secondary | ICD-10-CM | POA: Diagnosis not present

## 2014-09-03 DIAGNOSIS — Z6831 Body mass index (BMI) 31.0-31.9, adult: Secondary | ICD-10-CM | POA: Insufficient documentation

## 2014-09-03 DIAGNOSIS — Z1211 Encounter for screening for malignant neoplasm of colon: Secondary | ICD-10-CM | POA: Insufficient documentation

## 2014-09-03 DIAGNOSIS — D123 Benign neoplasm of transverse colon: Secondary | ICD-10-CM | POA: Insufficient documentation

## 2014-09-03 DIAGNOSIS — Z87891 Personal history of nicotine dependence: Secondary | ICD-10-CM | POA: Diagnosis not present

## 2014-09-03 DIAGNOSIS — Z96651 Presence of right artificial knee joint: Secondary | ICD-10-CM | POA: Insufficient documentation

## 2014-09-03 DIAGNOSIS — K640 First degree hemorrhoids: Secondary | ICD-10-CM | POA: Diagnosis not present

## 2014-09-03 DIAGNOSIS — D649 Anemia, unspecified: Secondary | ICD-10-CM | POA: Insufficient documentation

## 2014-09-03 DIAGNOSIS — K5730 Diverticulosis of large intestine without perforation or abscess without bleeding: Secondary | ICD-10-CM | POA: Diagnosis not present

## 2014-09-03 HISTORY — PX: COLONOSCOPY WITH PROPOFOL: SHX5780

## 2014-09-03 SURGERY — COLONOSCOPY WITH PROPOFOL
Anesthesia: General

## 2014-09-03 MED ORDER — PROPOFOL 10 MG/ML IV BOLUS
INTRAVENOUS | Status: DC | PRN
  Administered 2014-09-03: 50 mg via INTRAVENOUS

## 2014-09-03 MED ORDER — PHENYLEPHRINE HCL 10 MG/ML IJ SOLN
INTRAMUSCULAR | Status: DC | PRN
  Administered 2014-09-03 (×3): 100 ug via INTRAVENOUS

## 2014-09-03 MED ORDER — SODIUM CHLORIDE 0.9 % IV SOLN
INTRAVENOUS | Status: DC
  Administered 2014-09-03: 14:00:00 via INTRAVENOUS

## 2014-09-03 MED ORDER — PROPOFOL INFUSION 10 MG/ML OPTIME
INTRAVENOUS | Status: DC | PRN
  Administered 2014-09-03: 120 ug/kg/min via INTRAVENOUS

## 2014-09-03 MED ORDER — SODIUM CHLORIDE 0.9 % IV SOLN
INTRAVENOUS | Status: DC
  Administered 2014-09-03: 1000 mL via INTRAVENOUS

## 2014-09-03 MED ORDER — CARVEDILOL 12.5 MG PO TABS
ORAL_TABLET | ORAL | Status: AC
  Administered 2014-09-03: 12.5 mg via ORAL
  Filled 2014-09-03: qty 1

## 2014-09-03 MED ORDER — PIPERACILLIN-TAZOBACTAM 3.375 G IVPB 30 MIN
3.3750 g | Freq: Once | INTRAVENOUS | Status: AC
  Administered 2014-09-03: 3.375 g via INTRAVENOUS
  Filled 2014-09-03: qty 50

## 2014-09-03 MED ORDER — LIDOCAINE HCL (CARDIAC) 20 MG/ML IV SOLN
INTRAVENOUS | Status: DC | PRN
  Administered 2014-09-03: 60 mg via INTRAVENOUS

## 2014-09-03 MED ORDER — EPHEDRINE SULFATE 50 MG/ML IJ SOLN
INTRAMUSCULAR | Status: DC | PRN
  Administered 2014-09-03: 5 mg via INTRAVENOUS

## 2014-09-03 MED ORDER — CARVEDILOL 12.5 MG PO TABS
12.5000 mg | ORAL_TABLET | Freq: Once | ORAL | Status: AC
  Administered 2014-09-03: 12.5 mg via ORAL

## 2014-09-03 NOTE — Anesthesia Preprocedure Evaluation (Signed)
Anesthesia Evaluation  Patient identified by MRN, date of birth, ID band Patient awake  General Assessment Comment:Some of the medications for surgery left her somewhat agitated for a few days  Reviewed: Allergy & Precautions, NPO status , Patient's Chart, lab work & pertinent test results, reviewed documented beta blocker date and time   Airway Mallampati: III  TM Distance: <3 FB Neck ROM: Full    Dental  (+) Chipped   Pulmonary former smoker,  breath sounds clear to auscultation  Pulmonary exam normal       Cardiovascular hypertension, Pt. on medications and Pt. on home beta blockers Normal cardiovascular exam    Neuro/Psych Anxiety Questionable TIA HX negative neurological ROS     GI/Hepatic   Endo/Other    Renal/GU Hx of nephritis     Musculoskeletal  (+) Arthritis -, Osteoarthritis,    Abdominal Normal abdominal exam  (+)   Peds  Hematology  (+) anemia ,   Anesthesia Other Findings   Reproductive/Obstetrics                             Anesthesia Physical Anesthesia Plan  ASA: III  Anesthesia Plan: General   Post-op Pain Management:    Induction: Intravenous  Airway Management Planned: Nasal Cannula  Additional Equipment:   Intra-op Plan:   Post-operative Plan:   Informed Consent: I have reviewed the patients History and Physical, chart, labs and discussed the procedure including the risks, benefits and alternatives for the proposed anesthesia with the patient or authorized representative who has indicated his/her understanding and acceptance.   Dental advisory given  Plan Discussed with: CRNA and Surgeon  Anesthesia Plan Comments: (NS T wave abnormality in lateral leads)        Anesthesia Quick Evaluation

## 2014-09-03 NOTE — Transfer of Care (Signed)
Immediate Anesthesia Transfer of Care Note  Patient: Carolyn Swanson  Procedure(s) Performed: Procedure(s): COLONOSCOPY WITH PROPOFOL (N/A)  Patient Location: PACU  Anesthesia Type:General  Level of Consciousness: awake, alert , oriented and patient cooperative  Airway & Oxygen Therapy: Patient Spontanous Breathing and Patient connected to nasal cannula oxygen  Post-op Assessment: Report given to RN, Post -op Vital signs reviewed and stable and Patient moving all extremities X 4  Post vital signs: Reviewed and stable  Last Vitals:  Filed Vitals:   09/03/14 1420  BP: 112/59  Pulse: 76  Temp: 35.7 C  Resp: 13    Complications: No apparent anesthesia complications

## 2014-09-03 NOTE — Anesthesia Postprocedure Evaluation (Signed)
  Anesthesia Post-op Note  Patient: Carolyn Swanson  Procedure(s) Performed: Procedure(s): COLONOSCOPY WITH PROPOFOL (N/A)  Anesthesia type:General  Patient location: PACU  Post pain: Pain level controlled  Post assessment: Post-op Vital signs reviewed, Patient's Cardiovascular Status Stable, Respiratory Function Stable, Patent Airway and No signs of Nausea or vomiting  Post vital signs: Reviewed and stable  Last Vitals:  Filed Vitals:   09/03/14 1450  BP: 97/72  Pulse: 70  Temp:   Resp: 17    Level of consciousness: awake, alert  and patient cooperative  Complications: No apparent anesthesia complications

## 2014-09-03 NOTE — H&P (Signed)
Primary Care Physician:  Marton Redwood, MD Primary Gastroenterologist:  Dr. Vira Agar  Pre-Procedure History & Physical: HPI:  Carolyn Swanson is a 69 y.o. female is here for an colonoscopy.   Past Medical History  Diagnosis Date  . Complication of anesthesia     "I was crazy for about 4 days after surgery"  . Hypertension   . Hyperlipidemia   . Arthritis   . History of nephritis     as child - no residual problems  . Anxiety   . Gout 01/15  . Obesity     Past Surgical History  Procedure Laterality Date  . Joint replacement  2013    LEFT TOTAL KNEE  . Abdominal hysterectomy  1990  . Cholecystectomy  2010  . Total knee arthroplasty Right 02/04/2013    Procedure: RIGHT TOTAL KNEE ARTHROPLASTY;  Surgeon: Gearlean Alf, MD;  Location: [facility];  Service: Orthopedics;  Laterality: Right;    Prior to Admission medications   Medication Sig Start Date End Date Taking? Authorizing Provider  acetaminophen (TYLENOL) 325 MG tablet Take 2 tablets (650 mg total) by mouth every 6 (six) hours as needed for mild pain (or Fever >/= 101). 02/07/13  Yes Arlee Muslim, PA-C  allopurinol (ZYLOPRIM) 300 MG tablet 1 tablet daily. 06/19/13  Yes Historical Provider, MD  amLODipine (NORVASC) 5 MG tablet 1 tablet daily. 05/24/13  Yes Historical Provider, MD  amoxicillin (AMOXIL) 500 MG capsule As needed for dental 05/13/13  Yes Historical Provider, MD  atorvastatin (LIPITOR) 40 MG tablet Take 1 tablet (40 mg total) by mouth at bedtime. 02/07/13  Yes Arlee Muslim, PA-C  bisacodyl (DULCOLAX) 10 MG suppository Place 1 suppository (10 mg total) rectally daily as needed for moderate constipation. 02/07/13  Yes Arlee Muslim, PA-C  carvedilol (COREG) 12.5 MG tablet Take 1 tablet (12.5 mg total) by mouth 2 (two) times daily with a meal. 02/07/13  Yes Arlee Muslim, PA-C  clopidogrel (PLAVIX) 75 MG tablet 1 tablet daily. 05/24/13  Yes Historical Provider, MD  meloxicam (MOBIC) 15 MG tablet 1 tablet. As needed for pain 06/19/13   Yes Historical Provider, MD  colchicine 0.6 MG tablet 1 tablet daily. 04/10/13   Historical Provider, MD  enalapril (VASOTEC) 20 MG tablet 1 tablet daily. 05/24/13   Historical Provider, MD  lisinopril (PRINIVIL,ZESTRIL) 20 MG tablet 1 tablet daily. 05/13/13   Historical Provider, MD  metoprolol succinate (TOPROL-XL) 50 MG 24 hr tablet 1 tablet daily. 05/28/13   Historical Provider, MD  venlafaxine XR (EFFEXOR-XR) 75 MG 24 hr capsule Take 1 capsule (75 mg total) by mouth at bedtime. Patient not taking: Reported on 09/03/2014 02/07/13   Arlee Muslim, PA-C    Allergies as of 07/16/2014  . (No Known Allergies)    Family History  Problem Relation Age of Onset  . Breast cancer Mother   . Nephrolithiasis Son   . Diabetes Son   . Lung cancer Father   . Alcoholism Father     Social History   Social History  . Marital Status: Married    Spouse Name: [name]  . Number of Children: 2  . Years of Education: College   Occupational History  . Not on file.   Social History Main Topics  . Smoking status: Former Research scientist (life sciences)  . Smokeless tobacco: Never Used  . Alcohol Use: No  . Drug Use: No  . Sexual Activity: Not on file   Other Topics Concern  . Not on file   Social History  Narrative   Patient is married ([name]).   Patient has two children.   Patient has a college education.   Patient is left-handed.   Patient drinks one Diet coke per day.             Review of Systems: See HPI, otherwise negative ROS  Physical Exam: BP 160/80 mmHg  Pulse 100  Temp(Src) 98.1 F (36.7 C) (Oral)  Resp 20  Ht 5\' 7"  (1.702 m)  Wt 90.266 kg (199 lb)  BMI 31.16 kg/m2  SpO2 100%  LMP 09/03/2014 General:   Alert,  pleasant and cooperative in NAD Head:  Normocephalic and atraumatic. Neck:  Supple; no masses or thyromegaly. Lungs:  Clear throughout to auscultation.    Heart:  Regular rate and rhythm. Abdomen:  Soft, nontender and nondistended. Normal bowel sounds, without guarding, and without  rebound.   Neurologic:  Alert and  oriented x4;  grossly normal neurologically.  Impression/Plan: Carolyn Swanson is here for an colonoscopy to be performed for screening purposes  Risks, benefits, limitations, and alternatives regarding  colonoscopy have been reviewed with the patient.  Questions have been answered.  All parties agreeable.   Gaylyn Cheers, MD  09/03/2014, 1:32 PM

## 2014-09-03 NOTE — Op Note (Signed)
[facility] Patient Name: Carolyn Swanson Procedure Date: 09/03/2014 1:45 PM MRN: [id number] Account #: [id number] Date of Birth: [birth year] Admit Type: Outpatient Age: 69 Room: [facility] ENDO ROOM 3 Gender: Female Note Status: Finalized Procedure:         Colonoscopy Indications:       Screening for colorectal malignant neoplasm Providers:         Manya Silvas, MD Referring MD:      No Local Md, MD (Referring MD) Medicines:         Propofol per Anesthesia Complications:     No immediate complications. Procedure:         Pre-Anesthesia Assessment:                    - After reviewing the risks and benefits, the patient was                     deemed in satisfactory condition to undergo the procedure.                    After obtaining informed consent, the colonoscope was                     passed under direct vision. Throughout the procedure, the                     patient's blood pressure, pulse, and oxygen saturations                     were monitored continuously. The Olympus PCF-H180AL                     colonoscope ( S#: [id number] ) was introduced through the                     anus and advanced to the the cecum, identified by                     appendiceal orifice and ileocecal valve. The colonoscopy                     was performed without difficulty. The patient tolerated                     the procedure well. The quality of the bowel preparation                     was excellent. Findings:      A diminutive polyp was found at the splenic flexure. The polyp was       sessile. The polyp was removed with a jumbo cold forceps. Resection and       retrieval were complete.      Multiple small-mouthed diverticula were found in the sigmoid colon.      Internal hemorrhoids were found during endoscopy. The hemorrhoids were       small and Grade I (internal hemorrhoids that do not prolapse).      The exam was otherwise without  abnormality. Impression:        - One diminutive polyp at the splenic flexure. Resected                     and retrieved.                    -  Diverticulosis in the sigmoid colon.                    - Internal hemorrhoids.                    - The examination was otherwise normal. Recommendation:    - Await pathology results. Manya Silvas, MD 09/03/2014 2:10:31 PM This report has been signed electronically. Number of Addenda: 0 Note Initiated On: 09/03/2014 1:45 PM Scope Withdrawal Time: 0 hours 10 minutes 47 seconds  Total Procedure Duration: 0 hours 18 minutes 17 seconds       [facility]

## 2014-09-05 ENCOUNTER — Encounter: Payer: Self-pay | Admitting: Unknown Physician Specialty

## 2014-09-05 LAB — SURGICAL PATHOLOGY

## 2016-03-02 ENCOUNTER — Other Ambulatory Visit: Payer: Self-pay | Admitting: Internal Medicine

## 2016-03-02 DIAGNOSIS — R748 Abnormal levels of other serum enzymes: Secondary | ICD-10-CM

## 2016-04-29 ENCOUNTER — Other Ambulatory Visit: Payer: Medicare Other

## 2016-04-29 ENCOUNTER — Ambulatory Visit
Admission: RE | Admit: 2016-04-29 | Discharge: 2016-04-29 | Disposition: A | Discharge status: Home / Self Care | Payer: Medicare Other | Source: Ambulatory Visit | Attending: Internal Medicine | Admitting: Internal Medicine

## 2016-04-29 DIAGNOSIS — R748 Abnormal levels of other serum enzymes: Secondary | ICD-10-CM

## 2017-03-22 DIAGNOSIS — L821 Other seborrheic keratosis: Secondary | ICD-10-CM | POA: Diagnosis not present

## 2017-05-29 DIAGNOSIS — H25813 Combined forms of age-related cataract, bilateral: Secondary | ICD-10-CM | POA: Diagnosis not present

## 2017-07-17 DIAGNOSIS — E1129 Type 2 diabetes mellitus with other diabetic kidney complication: Secondary | ICD-10-CM | POA: Diagnosis not present

## 2017-08-30 DIAGNOSIS — M859 Disorder of bone density and structure, unspecified: Secondary | ICD-10-CM | POA: Diagnosis not present

## 2017-08-30 DIAGNOSIS — E7849 Other hyperlipidemia: Secondary | ICD-10-CM | POA: Diagnosis not present

## 2017-08-30 DIAGNOSIS — M1000 Idiopathic gout, unspecified site: Secondary | ICD-10-CM | POA: Diagnosis not present

## 2017-08-30 DIAGNOSIS — E1129 Type 2 diabetes mellitus with other diabetic kidney complication: Secondary | ICD-10-CM | POA: Diagnosis not present

## 2017-08-30 DIAGNOSIS — E538 Deficiency of other specified B group vitamins: Secondary | ICD-10-CM | POA: Diagnosis not present

## 2017-08-30 DIAGNOSIS — I10 Essential (primary) hypertension: Secondary | ICD-10-CM | POA: Diagnosis not present

## 2017-09-06 DIAGNOSIS — E7849 Other hyperlipidemia: Secondary | ICD-10-CM | POA: Diagnosis not present

## 2017-09-06 DIAGNOSIS — N183 Chronic kidney disease, stage 3 (moderate): Secondary | ICD-10-CM | POA: Diagnosis not present

## 2017-09-06 DIAGNOSIS — G4733 Obstructive sleep apnea (adult) (pediatric): Secondary | ICD-10-CM | POA: Diagnosis not present

## 2017-09-06 DIAGNOSIS — M1000 Idiopathic gout, unspecified site: Secondary | ICD-10-CM | POA: Diagnosis not present

## 2017-09-06 DIAGNOSIS — D6489 Other specified anemias: Secondary | ICD-10-CM | POA: Diagnosis not present

## 2017-09-06 DIAGNOSIS — E1129 Type 2 diabetes mellitus with other diabetic kidney complication: Secondary | ICD-10-CM | POA: Diagnosis not present

## 2017-09-06 DIAGNOSIS — Z6834 Body mass index (BMI) 34.0-34.9, adult: Secondary | ICD-10-CM | POA: Diagnosis not present

## 2017-09-06 DIAGNOSIS — E538 Deficiency of other specified B group vitamins: Secondary | ICD-10-CM | POA: Diagnosis not present

## 2017-09-06 DIAGNOSIS — M8580 Other specified disorders of bone density and structure, unspecified site: Secondary | ICD-10-CM | POA: Diagnosis not present

## 2017-09-06 DIAGNOSIS — I10 Essential (primary) hypertension: Secondary | ICD-10-CM | POA: Diagnosis not present

## 2017-09-06 DIAGNOSIS — Z0000 Encounter for general adult medical examination without abnormal findings: Secondary | ICD-10-CM | POA: Diagnosis not present

## 2017-09-06 DIAGNOSIS — Z1389 Encounter for screening for other disorder: Secondary | ICD-10-CM | POA: Diagnosis not present

## 2017-09-06 DIAGNOSIS — G3184 Mild cognitive impairment, so stated: Secondary | ICD-10-CM | POA: Diagnosis not present

## 2017-10-03 DIAGNOSIS — H18413 Arcus senilis, bilateral: Secondary | ICD-10-CM | POA: Diagnosis not present

## 2017-10-03 DIAGNOSIS — H353131 Nonexudative age-related macular degeneration, bilateral, early dry stage: Secondary | ICD-10-CM | POA: Diagnosis not present

## 2017-10-03 DIAGNOSIS — H2513 Age-related nuclear cataract, bilateral: Secondary | ICD-10-CM | POA: Diagnosis not present

## 2017-10-03 DIAGNOSIS — H25043 Posterior subcapsular polar age-related cataract, bilateral: Secondary | ICD-10-CM | POA: Diagnosis not present

## 2017-10-03 DIAGNOSIS — H2512 Age-related nuclear cataract, left eye: Secondary | ICD-10-CM | POA: Diagnosis not present

## 2017-11-08 DIAGNOSIS — Z8781 Personal history of (healed) traumatic fracture: Secondary | ICD-10-CM | POA: Diagnosis not present

## 2017-11-08 DIAGNOSIS — R208 Other disturbances of skin sensation: Secondary | ICD-10-CM | POA: Diagnosis not present

## 2017-11-08 DIAGNOSIS — G3184 Mild cognitive impairment, so stated: Secondary | ICD-10-CM | POA: Diagnosis not present

## 2017-11-08 DIAGNOSIS — R2689 Other abnormalities of gait and mobility: Secondary | ICD-10-CM | POA: Diagnosis not present

## 2017-11-08 DIAGNOSIS — G4733 Obstructive sleep apnea (adult) (pediatric): Secondary | ICD-10-CM | POA: Diagnosis not present

## 2017-11-20 DIAGNOSIS — H2512 Age-related nuclear cataract, left eye: Secondary | ICD-10-CM | POA: Diagnosis not present

## 2017-11-20 DIAGNOSIS — H52202 Unspecified astigmatism, left eye: Secondary | ICD-10-CM | POA: Diagnosis not present

## 2017-11-21 DIAGNOSIS — H2511 Age-related nuclear cataract, right eye: Secondary | ICD-10-CM | POA: Diagnosis not present

## 2017-12-07 DIAGNOSIS — Z96653 Presence of artificial knee joint, bilateral: Secondary | ICD-10-CM | POA: Diagnosis not present

## 2017-12-07 DIAGNOSIS — M25562 Pain in left knee: Secondary | ICD-10-CM | POA: Diagnosis not present

## 2017-12-07 DIAGNOSIS — Z471 Aftercare following joint replacement surgery: Secondary | ICD-10-CM | POA: Diagnosis not present

## 2017-12-11 DIAGNOSIS — H2511 Age-related nuclear cataract, right eye: Secondary | ICD-10-CM | POA: Diagnosis not present

## 2017-12-11 DIAGNOSIS — H52201 Unspecified astigmatism, right eye: Secondary | ICD-10-CM | POA: Diagnosis not present

## 2017-12-25 DIAGNOSIS — Z803 Family history of malignant neoplasm of breast: Secondary | ICD-10-CM | POA: Diagnosis not present

## 2017-12-25 DIAGNOSIS — Z1231 Encounter for screening mammogram for malignant neoplasm of breast: Secondary | ICD-10-CM | POA: Diagnosis not present

## 2018-02-21 DIAGNOSIS — M25521 Pain in right elbow: Secondary | ICD-10-CM | POA: Diagnosis not present

## 2018-02-21 DIAGNOSIS — M25511 Pain in right shoulder: Secondary | ICD-10-CM | POA: Diagnosis not present

## 2018-03-08 DIAGNOSIS — D649 Anemia, unspecified: Secondary | ICD-10-CM | POA: Diagnosis not present

## 2018-03-08 DIAGNOSIS — Z6834 Body mass index (BMI) 34.0-34.9, adult: Secondary | ICD-10-CM | POA: Diagnosis not present

## 2018-03-08 DIAGNOSIS — N183 Chronic kidney disease, stage 3 (moderate): Secondary | ICD-10-CM | POA: Diagnosis not present

## 2018-03-08 DIAGNOSIS — E7849 Other hyperlipidemia: Secondary | ICD-10-CM | POA: Diagnosis not present

## 2018-03-08 DIAGNOSIS — M859 Disorder of bone density and structure, unspecified: Secondary | ICD-10-CM | POA: Diagnosis not present

## 2018-03-08 DIAGNOSIS — E668 Other obesity: Secondary | ICD-10-CM | POA: Diagnosis not present

## 2018-03-08 DIAGNOSIS — E1129 Type 2 diabetes mellitus with other diabetic kidney complication: Secondary | ICD-10-CM | POA: Diagnosis not present

## 2018-03-08 DIAGNOSIS — I10 Essential (primary) hypertension: Secondary | ICD-10-CM | POA: Diagnosis not present

## 2018-09-11 DIAGNOSIS — N183 Chronic kidney disease, stage 3 (moderate): Secondary | ICD-10-CM | POA: Diagnosis not present

## 2018-09-11 DIAGNOSIS — M8580 Other specified disorders of bone density and structure, unspecified site: Secondary | ICD-10-CM | POA: Diagnosis not present

## 2018-09-11 DIAGNOSIS — E785 Hyperlipidemia, unspecified: Secondary | ICD-10-CM | POA: Diagnosis not present

## 2018-09-11 DIAGNOSIS — Z0000 Encounter for general adult medical examination without abnormal findings: Secondary | ICD-10-CM | POA: Diagnosis not present

## 2018-09-11 DIAGNOSIS — E538 Deficiency of other specified B group vitamins: Secondary | ICD-10-CM | POA: Diagnosis not present

## 2018-09-11 DIAGNOSIS — E1129 Type 2 diabetes mellitus with other diabetic kidney complication: Secondary | ICD-10-CM | POA: Diagnosis not present

## 2018-09-11 DIAGNOSIS — M109 Gout, unspecified: Secondary | ICD-10-CM | POA: Diagnosis not present

## 2018-09-18 DIAGNOSIS — D649 Anemia, unspecified: Secondary | ICD-10-CM | POA: Diagnosis not present

## 2018-09-18 DIAGNOSIS — G4733 Obstructive sleep apnea (adult) (pediatric): Secondary | ICD-10-CM | POA: Diagnosis not present

## 2018-09-18 DIAGNOSIS — I129 Hypertensive chronic kidney disease with stage 1 through stage 4 chronic kidney disease, or unspecified chronic kidney disease: Secondary | ICD-10-CM | POA: Diagnosis not present

## 2018-09-18 DIAGNOSIS — Z1331 Encounter for screening for depression: Secondary | ICD-10-CM | POA: Diagnosis not present

## 2018-09-18 DIAGNOSIS — I10 Essential (primary) hypertension: Secondary | ICD-10-CM | POA: Diagnosis not present

## 2018-09-18 DIAGNOSIS — M8580 Other specified disorders of bone density and structure, unspecified site: Secondary | ICD-10-CM | POA: Diagnosis not present

## 2018-09-18 DIAGNOSIS — G3184 Mild cognitive impairment, so stated: Secondary | ICD-10-CM | POA: Diagnosis not present

## 2018-09-18 DIAGNOSIS — Z0000 Encounter for general adult medical examination without abnormal findings: Secondary | ICD-10-CM | POA: Diagnosis not present

## 2018-09-18 DIAGNOSIS — N183 Chronic kidney disease, stage 3 (moderate): Secondary | ICD-10-CM | POA: Diagnosis not present

## 2018-09-18 DIAGNOSIS — M109 Gout, unspecified: Secondary | ICD-10-CM | POA: Diagnosis not present

## 2018-09-18 DIAGNOSIS — E538 Deficiency of other specified B group vitamins: Secondary | ICD-10-CM | POA: Diagnosis not present

## 2018-09-18 DIAGNOSIS — E785 Hyperlipidemia, unspecified: Secondary | ICD-10-CM | POA: Diagnosis not present

## 2018-09-18 DIAGNOSIS — E1129 Type 2 diabetes mellitus with other diabetic kidney complication: Secondary | ICD-10-CM | POA: Diagnosis not present

## 2018-10-02 DIAGNOSIS — E1129 Type 2 diabetes mellitus with other diabetic kidney complication: Secondary | ICD-10-CM | POA: Diagnosis not present

## 2018-10-02 DIAGNOSIS — Z7689 Persons encountering health services in other specified circumstances: Secondary | ICD-10-CM | POA: Diagnosis not present

## 2019-02-11 DIAGNOSIS — M85851 Other specified disorders of bone density and structure, right thigh: Secondary | ICD-10-CM | POA: Diagnosis not present

## 2019-02-11 DIAGNOSIS — Z1231 Encounter for screening mammogram for malignant neoplasm of breast: Secondary | ICD-10-CM | POA: Diagnosis not present

## 2019-02-11 DIAGNOSIS — M85852 Other specified disorders of bone density and structure, left thigh: Secondary | ICD-10-CM | POA: Diagnosis not present

## 2019-11-04 ENCOUNTER — Ambulatory Visit: Payer: Medicare PPO | Attending: Internal Medicine

## 2019-11-04 DIAGNOSIS — Z23 Encounter for immunization: Secondary | ICD-10-CM

## 2019-11-04 NOTE — Progress Notes (Signed)
   Covid-19 Vaccination Clinic  Name:  Carolyn Swanson    MRN: [id number] DOB: 1945-07-01  11/04/2019  Carolyn Swanson was observed post Covid-19 immunization for 15 minutes without incident. She was provided with Vaccine Information Sheet and instruction to access the V-Safe system.   Carolyn Swanson was instructed to call 911 with any severe reactions post vaccine: Marland Kitchen Difficulty breathing  . Swelling of face and throat  . A fast heartbeat  . A bad rash all over body  . Dizziness and weakness

## 2019-11-18 ENCOUNTER — Ambulatory Visit: Payer: Medicare PPO | Admitting: Neurology

## 2019-11-18 ENCOUNTER — Encounter: Payer: Self-pay | Admitting: Neurology

## 2019-11-18 ENCOUNTER — Other Ambulatory Visit: Payer: Self-pay

## 2019-11-18 ENCOUNTER — Telehealth: Payer: Self-pay | Admitting: Neurology

## 2019-11-18 VITALS — BP 113/57 | HR 49 | Ht 67.0 in | Wt 198.0 lb

## 2019-11-18 DIAGNOSIS — Z789 Other specified health status: Secondary | ICD-10-CM | POA: Diagnosis not present

## 2019-11-18 DIAGNOSIS — G3184 Mild cognitive impairment, so stated: Secondary | ICD-10-CM

## 2019-11-18 DIAGNOSIS — R0683 Snoring: Secondary | ICD-10-CM

## 2019-11-18 DIAGNOSIS — G309 Alzheimer's disease, unspecified: Secondary | ICD-10-CM

## 2019-11-18 DIAGNOSIS — G4710 Hypersomnia, unspecified: Secondary | ICD-10-CM | POA: Diagnosis not present

## 2019-11-18 DIAGNOSIS — R441 Visual hallucinations: Secondary | ICD-10-CM

## 2019-11-18 NOTE — Progress Notes (Signed)
SLEEP MEDICINE CLINIC    Provider:  Larey Seat, MD  Primary Care Physician:  Marton Redwood, MD [place] [address]     Referring Provider: Marton Redwood, [place],  [place] [address]          Chief Complaint according to patient   Patient presents with:    . New Patient (Initial Visit)     rm 10 last sleep study was completed 2015. received a machine and attempted using it. was unable to tolerate the machine.willing to try again if there are newer things she can try. she states that her memory is ok but her childern have notices things.  keeping up with time, hallucinating of her husband, who has passed. One day she woke up but didnt know where she was and she was in her bedroom.  she has been locking herself out of the house frequently.       HISTORY OF PRESENT ILLNESS:  Carolyn Swanson is a 74 y.o. year old  Caucasian female patient seen here upon referral on 11/18/2019 . The re-evaluation for sleep was prompted by her son in law, but there is a second concern of memory>   Chief concern according to patient :  " I couldn't use the nasal pillows"   I have the pleasure of seeing Carolyn Swanson today, a left -handed Caucasian female with a possible sleep disorder.   She  has a past medical history of Anxiety, Arthritis, Complication of anesthesia, Gout (01/15), History of nephritis, Hyperlipidemia, Hypertension, and Obesity. she obviously has a history of OSA that never made it into EPIC.  The patient also has type 2 diabetes mellitus with other diabetic kidney complication chronic kidney disease has reached stage IIIa, hypertensive chronic kidney disease.  Gout unspecified but current uric acid levels were normal.  Osteopenia, Dr. 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  She clearly has a vitamin B12 deficiency which is now supplemented but reflected in an elevated MCH.  She had thrombocytopenia.  Her  HbA1c was excellent in comparison to last years. The patient has begun seeing her deceased spouse and parents- she not just sees them she speaks with them, but they don't answer.  These encounters were comforting. Only visual hallucinations.  She called her daughter at night- twice in one week- confused about her surrounding. Imposter syndrome- she was in a hotel that was decorated as if it was her home bedroom.      The patient had the first sleep study in the year 2015 at [facility]-   The patient underwent in August 2015 a sleep study by split-night protocol her baseline study had shown an AHI of 56.5/h these were hypopneas shallow breathing spells and not frank apneas.  All of her hypopneas took place in non-REM sleep.  There was a supine sleep accentuation of the AHI in supine sleep her AHI was 82.5 and nonsupine 54.4/h.  The lowest point of oxygen saturation was 74% was 52.7 minutes of total desaturation which was clinically critical.  She had several PLM's.  She was titrated the same night beginning at 5 cm water pressure to 11 cm water pressure but there was no complete resolution of her AHI.  She still had an oxygen nadir of 82% and she still had 26.6 minutes of total desaturation time.  None of the CPAP pressures been 5 and 11 cm appeared to be effective and she  was therefore invited back to a full night titration which took place on 09-20-13.  This time the best tolerance was seen at 8 cmH2O with an AHI of 2.1 unfortunately other Modalities such as BiPAP were not even applied that night.  The sleep tech used a full facemask which the patient had explicitly not warm to the.  I had later to change her back to a nasal pillow.  Unfortunately the nasal pillow felt too large and nonfitting to her.  She soon became noncompliant with CPAP but she stated that she had "contacted her DME and was not given further assistance.   Sleep relevant medical history: Nocturnal and daytime hallucinations. Nocturia  2-3 times.     Family medical /sleep history: no other family member on CPAP with OSA, mother had dementia. 3 aunts with memory loss.    Social history:  Patient is retired from Printmaker, middle school- Optometrist and lives in a household alone.  Family status is widowed , with 2 children, several grandchildren. One dog.  Tobacco use: never .  ETOH use : never ,  Caffeine intake in form of Coffee( /) Soda( 1-3/ day) Tea () or energy drinks. Regular exercise - none  .         Sleep habits are as follows: The patient's dinner time is between 6-8 PM. The patient goes to bed at 10 PM and goes to sleep within one hour- continues to sleep for 3-4  Hours.  Her daughter stated she likes to go to an armchair after 4-5 AM.  Total sleep time is about 7-8 hours. The preferred sleep position is on the right side , with the support of 1 pillow.  Dreams are reportedly frequent/vivid.  8-12 AM is the usual rise time. The patient wakes up spontaneously.   She reports  feeling refreshed/ restored in AM, with symptoms such as dry mouth, and residual fatigue.  Naps are taken frequently, lasting from 15-60 minutes and are more refreshing than nocturnal sleep.     Review of Systems: Out of a complete 14 system review, the patient complains of only the following symptoms, and all other reviewed systems are negative.:  Fatigue, sleepiness , snoring, fragmented sleep, Insomnia - fragmentation.    How likely are you to doze in the following situations: 0 = not likely, 1 = slight chance, 2 = moderate chance, 3 = high chance   Sitting and Reading? Watching Television? Sitting inactive in a public place (theater or meeting)? As a passenger in a car for an hour without a break? Lying down in the afternoon when circumstances permit? Sitting and talking to someone? Sitting quietly after lunch without alcohol? In a car, while stopped for a few minutes in traffic?   Total = 17/ 24 points   FSS endorsed  at NA / 63 points. GDS - not answered.    Social History   Socioeconomic History  . Marital status: Married    Spouse name: [name]  . Number of children: 2  . Years of education: College  . Highest education level: Not on file  Occupational History  . Not on file  Tobacco Use  . Smoking status: Former Research scientist (life sciences)  . Smokeless tobacco: Never Used  Substance and Sexual Activity  . Alcohol use: No  . Drug use: No  . Sexual activity: Not on file  Other Topics Concern  . Not on file  Social History Narrative   Patient is  widowed Delfino Lovett passed in 2018 after  a long illness- she was his caretaker ).   Patient has two adult children- son and daughter Chrys Racer . Marland Kitchen   Patient has a Financial risk analyst, worked as a Optometrist.    Patient is left-handed.   Patient drinks one to 3 Diet coke per day.            Social Determinants of Health   Financial Resource Strain:   . Difficulty of Paying Living Expenses: Not on file  Food Insecurity:   . Worried About Charity fundraiser in the Last Year: Not on file  . Ran Out of Food in the Last Year: Not on file  Transportation Needs:   . Lack of Transportation (Medical): Not on file  . Lack of Transportation (Non-Medical): Not on file  Physical Activity:   . Days of Exercise per Week: Not on file  . Minutes of Exercise per Session: Not on file  Stress:   . Feeling of Stress : Not on file  Social Connections:   . Frequency of Communication with Friends and Family: Not on file  . Frequency of Social Gatherings with Friends and Family: Not on file  . Attends Religious Services: Not on file  . Active Member of Clubs or Organizations: Not on file  . Attends Archivist Meetings: Not on file  . Marital Status: Not on file    Family History  Problem Relation Age of Onset  . Breast cancer Mother   . Nephrolithiasis Son   . Diabetes Son   . Lung cancer Father   . Alcoholism Father     Past Medical History:  Diagnosis  Date  . Anxiety   . Arthritis   . Complication of anesthesia    "I was crazy for about 4 days after surgery"  . Gout 01/15  . History of nephritis    as child - no residual problems  . Hyperlipidemia   . Hypertension   . Obesity     Past Surgical History:  Procedure Laterality Date  . ABDOMINAL HYSTERECTOMY  1990  . CHOLECYSTECTOMY  2010  . COLONOSCOPY WITH PROPOFOL N/A 09/03/2014   Procedure: COLONOSCOPY WITH PROPOFOL;  Surgeon: Manya Silvas, MD;  Location: [facility] ENDOSCOPY;  Service: Endoscopy;  Laterality: N/A;  . JOINT REPLACEMENT  2013   LEFT TOTAL KNEE  . TOTAL KNEE ARTHROPLASTY Right 02/04/2013   Procedure: RIGHT TOTAL KNEE ARTHROPLASTY;  Surgeon: Gearlean Alf, MD;  Location: [facility] ORS;  Service: Orthopedics;  Laterality: Right;     Current Outpatient Medications on File Prior to Visit  Medication Sig Dispense Refill  . acetaminophen (TYLENOL) 325 MG tablet Take 2 tablets (650 mg total) by mouth every 6 (six) hours as needed for mild pain (or Fever >/= 101). 40 tablet 0  . allopurinol (ZYLOPRIM) 100 MG tablet Take 100 mg by mouth daily.    Marland Kitchen amLODipine (NORVASC) 5 MG tablet 1 tablet daily.    Marland Kitchen amoxicillin (AMOXIL) 500 MG capsule As needed for dental    . aspirin 81 MG EC tablet Take by mouth.    Marland Kitchen atorvastatin (LIPITOR) 20 MG tablet Take 20 mg by mouth daily.    . cholecalciferol (VITAMIN D3) 25 MCG (1000 UNIT) tablet Take 1,000 Units by mouth daily.    Marland Kitchen donepezil (ARICEPT) 5 MG tablet Take 5 mg by mouth at bedtime.    Marland Kitchen lisinopril (PRINIVIL,ZESTRIL) 20 MG tablet 1 tablet daily.    . metoprolol succinate (TOPROL-XL) 50 MG 24 hr  tablet 1 tablet daily.    Marland Kitchen venlafaxine XR (EFFEXOR-XR) 75 MG 24 hr capsule Take 1 capsule (75 mg total) by mouth at bedtime. 30 capsule 0   No current facility-administered medications on file prior to visit.    No Known Allergies  Physical exam:  Today's Vitals   11/18/19 0941  BP: (!) 113/57  Pulse: (!) 49  Weight: 198 lb (89.8 kg)   Height: 5\' 7"  (1.702 m)   Body mass index is 31.01 kg/m.   Wt Readings from Last 3 Encounters:  11/18/19 198 lb (89.8 kg)  09/03/14 199 lb (90.3 kg)  08/30/13 188 lb (85.3 kg)     Ht Readings from Last 3 Encounters:  11/18/19 5\' 7"  (1.702 m)  09/03/14 5\' 7"  (1.702 m)  06/20/13 5' 6.5" (1.689 m)      General: The patient is awake, alert and appears not in acute distress. The patient is well groomed. Head: Normocephalic, atraumatic. Neck is supple.  Mallampati: 3 ,  neck circumference: 16 inches . Nasal airflow patent.  Retrognathia is not seen. Small lower jaw.  Dental status: native  Cardiovascular:  Regular rate and cardiac rhythm by pulse,  without distended neck veins. Respiratory: Lungs are clear to auscultation.  Skin:  Without evidence of ankle edema, or rash. Trunk: The patient's posture is erect.   Neurologic exam : The patient is awake and alert, oriented to place and time.   Memory subjective described as intact.  Attention span & concentration ability appears normal.   No flowsheet data found.  mini-mental status exam The Mini-Mental Status Examination today scored 26 out of 30 points, she lost 2 points in the last 2 out of 5 steps with serial 7 calculation she was fully oriented to place to time she could name 3 words, when asked to recall these words she got 2 out of 3 right.  She was repeating she was drawing a design for [place] and had problems with that.  She had also trouble with clock face.  Speech is fluent,  without  dysarthria, dysphonia or aphasia.  Mood and affect are appropriate.   Cranial nerves: no loss of smell or taste reported  Pupils are equal and briskly reactive to light. Funduscopic exam deferred..  Extraocular movements in vertical and horizontal planes were intact and without nystagmus.  No Diplopia. Visual fields by finger perimetry are intact. Hearing was intact to soft voice and finger rubbing.    Facial sensation intact to fine  touch.  Facial motor strength is symmetric and tongue and uvula move midline.  Neck ROM : rotation, tilt and flexion extension were normal for age and shoulder shrug was symmetrical.    Motor exam:  Symmetric bulk, tone and ROM.   Normal tone without cog wheeling, symmetric grip strength .   Sensory:  Fine touch, pinprick and vibration were tested  and  normal.  Proprioception tested in the upper extremities was normal.   Coordination: Rapid alternating movements in the fingers/hands were of normal speed.  The Finger-to-nose maneuver was intact without evidence of ataxia, dysmetria or tremor.   Gait and station: Patient could rise unassisted from a seated position, walked without assistive device.  Stance is of normal width/ base .  Toe and heel walk were deferred.  Deep tendon reflexes: in the  upper and lower extremities are symmetric and intact. Bilateral knee replacement. Babinski response was deferred.     After spending a total time of 45  minutes face to  face and additional time for physical and neurologic examination, review of laboratory studies,  personal review of imaging studies, reports and results of other testing and review of referral information / records as far as provided in visit, I have established the following assessments:  1) Mrs. Archie Balboa presents for 2 main causes 1 is a reevaluation of her previously diagnosed sleep apnea which was not strictly obstructive in origin and actually never responded well to CPAP.  #2 is about recent memory changes, some behavior changes, daytime and nocturnal confusional spells and visual hallucinations without an auditory component.  These confusions may be a manifestation of an early dementia also her Mini-Mental Status Examination does not reveal this diagnosis at 26 out of 30 points. The patient was fully oriented here she was some visual spatial difficulties with clock drawing and I would recommend to do a [place] cognitive  assessment the next time this I would like to look at her Trail test.    In addition there is an organic component that may contribute to cognitive changes in form of vitamin B12 deficiency which is now supplemented.  Her diabetes has been much better controlled than last year, she is diagnosed with kidney disease stage III due to hypertension and diabetic changes.   My Plan is to proceed with:  1) I would like to order an attended sleep study with a parasomnia montage for this patient my goal is to see if she still has apnea and of what kind it is well possible that she does not need intervention for sleep apnea at all at this time. 2) I am concerned that this could be an early manifestation of dementia, the most likely would be the most common disease process which is Alzheimer's disease.  She does have a strong maternal family history of 3 arms being affected by memory loss.  Her aunts were 56 or older than they manifested. 3) the patient had at this time no imaging study of the brain which I would strongly encourage.  Her last basic metabolic panel was from September 16 and I am not sure that the radiology department will accepted however her creatinine was 1.5 and would be considered elevated for her age.  There were no other metabolic concerns the patient did not appear to be well-hydrated at the time these tests were drawn.  I would like to thank Marton Redwood, MD and Marton Redwood, [address] [place],  [place] [address] for allowing me to meet with and to take care of this pleasant patient.    I plan to follow up either personally or through our NP within 3 month.   CC: I will share my notes with PCP, Dr Brigitte Pulse. .  Electronically signed by: Larey Seat, MD 11/18/2019 9:59 AM  [facility] Board certified by The [employer] of Sleep Medicine and Diplomate of the [employer] of Sleep Medicine. Board certified In Neurology through the  [place], Fellow of the [employer] of Neurology. Medical Director of [facility].

## 2019-11-18 NOTE — Telephone Encounter (Signed)
[employer] pending  

## 2019-11-18 NOTE — Addendum Note (Signed)
Addended by: Larey Seat on: 11/18/2019 10:52 AM   Modules accepted: Orders

## 2019-11-18 NOTE — Patient Instructions (Signed)

## 2019-11-19 NOTE — Telephone Encounter (Signed)
Mcarthur Rossetti Josem Kaufmann: [id number] (exp. 11/18/19 to 12/18/19) order sent to GI. They will reach out to the patient to schedule.

## 2019-12-02 ENCOUNTER — Other Ambulatory Visit: Payer: Self-pay | Admitting: Neurology

## 2019-12-02 MED ORDER — ALPRAZOLAM 0.5 MG PO TABS: 0.5000 mg | ORAL_TABLET | Freq: Once | ORAL | 0 refills | Status: DC | PRN

## 2019-12-02 NOTE — Addendum Note (Signed)
Addended by: Darleen Crocker on: 12/02/2019 11:08 AM   Modules accepted: Orders

## 2019-12-02 NOTE — Telephone Encounter (Signed)
Called the pt's daughter back and advised of the reason that Dr Brett Fairy wanted to move forward with the imaging. She indicated that her mom is a little hesitant with completing the MRI. She is anxious and slightly claustrophobic. Informed the patient I would make Dr Dohmeier aware and she may potentially call in 1-2 tablets of medication for her to take for the MRI. She was appreciative. Advised I would call the her back if Dr Brett Fairy decides not to.

## 2019-12-02 NOTE — Telephone Encounter (Signed)
Pt.'s daughter Chrys Racer is on [place]. She is wanting to know if MRI tomorrow is absolutely necessary, what is the reason for it. She states mom is concerned about it. Please advise.

## 2019-12-03 ENCOUNTER — Other Ambulatory Visit: Payer: Medicare PPO

## 2019-12-05 ENCOUNTER — Ambulatory Visit (INDEPENDENT_AMBULATORY_CARE_PROVIDER_SITE_OTHER): Payer: Medicare PPO | Admitting: Neurology

## 2019-12-05 ENCOUNTER — Other Ambulatory Visit: Payer: Self-pay

## 2019-12-05 DIAGNOSIS — R441 Visual hallucinations: Secondary | ICD-10-CM

## 2019-12-05 DIAGNOSIS — Z789 Other specified health status: Secondary | ICD-10-CM

## 2019-12-05 DIAGNOSIS — G4733 Obstructive sleep apnea (adult) (pediatric): Secondary | ICD-10-CM | POA: Diagnosis not present

## 2019-12-05 DIAGNOSIS — R0683 Snoring: Secondary | ICD-10-CM

## 2019-12-05 DIAGNOSIS — G3184 Mild cognitive impairment, so stated: Secondary | ICD-10-CM

## 2019-12-05 DIAGNOSIS — G4710 Hypersomnia, unspecified: Secondary | ICD-10-CM

## 2019-12-20 DIAGNOSIS — G3184 Mild cognitive impairment, so stated: Secondary | ICD-10-CM | POA: Insufficient documentation

## 2019-12-20 DIAGNOSIS — Z789 Other specified health status: Secondary | ICD-10-CM | POA: Insufficient documentation

## 2019-12-20 DIAGNOSIS — R441 Visual hallucinations: Secondary | ICD-10-CM | POA: Insufficient documentation

## 2019-12-20 NOTE — Progress Notes (Signed)
IMPRESSION:   1. Moderate degree of Obstructive Sleep Apnea (OSA) was noted,  not REM sleep dependent and not associated with hypoxemia.  2. Mild Periodic Limb Movement Disorder (PLMD), sparing REM  sleep. No indication of REM sleep behavior disorder.  3. Sleep talking in REM sleep.   RECOMMENDATIONS:   1. Advise to retry CPAP therapy- either auto titration CPAP or a  full night, attended, CPAP titration study to optimize therapy.  I would like for this patient to be fitted with a comfortable interface in either our lab or at the DME.  She could even consider a dental device to treat snoring and non REM dependent apnea.  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 The patient is not sleep walking, not falling out of bed.

## 2019-12-20 NOTE — Procedures (Signed)
PATIENT'S NAME:  Carolyn, Swanson DOB:      [birth year]      MR#:    [id number]     DATE OF RECORDING: 12/05/2019 M. Ronni Rumble M.D.:  Marton Redwood, MD Study Performed:   Emmaline Kluver HISTORY:  Carolyn Swanson is a 74- year- old Caucasian female patient seen on 11/18/2019. The re-evaluation for sleep was prompted by her son in law, but there is a second concern of memory problems.   Chief concern according to patient:  " I couldn't use the nasal pillows" She obviously has a history of OSA that never made it into EPIC, and she was tried on CPAP. Her last sleep study was completed in 2015 and received a CPAP machine, she attempted using it, but reportedly was unable to tolerate the therapy. She indicated she is willing to try again if there are newer things she can try.  The patient also has type 2 diabetes mellitus with other diabetic kidney complication chronic kidney disease has reached stage IIIa, hypertensive chronic kidney disease. Gout unspecified but current uric acid levels were normal.  Osteopenia. She clearly has a vitamin B12 deficiency which is now supplemented but reflected in an elevated MCH.  She had thrombocytopenia.  Her HbA1c was excellent in comparison to last year's. Dr. 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 She states that her memory is ok, but her children disagree. She has trouble keeping up with time, is napping very frequently. She wakes up in her own bedroom but doesn't know where she is and doesn't recognize her surroundings. She called her daughter at night- twice in one week- confused about her surroundings, and she believed she woke in a hotel room that was decorated as if it was her home bedroom.   She has been locking herself out of the house, and with more frequency.   The patient has begun seeing her deceased spouse and parents- she not just sees them she speaks with them, but they don't answer. These  encounters were comforting to her and only consist of visual hallucinations.   The patient endorsed the Epworth Sleepiness Scale at 17 points.  MMSE 26/30 points.  The patient's weight 198 pounds with a height of 67 (inches), resulting in a BMI of 31.1 kg/m2. The patient's neck circumference measured 16 inches.  CURRENT MEDICATIONS: Zyloprim, Norvasc, Amoxil, Aspirin, Lipitor, Vitamin D3, Aricept, Prinivil, Toprol-XL, Effexor-XR   PROCEDURE:  This is a multichannel digital polysomnogram utilizing the Somnostar 11.2 system.  Electrodes and sensors were applied and monitored per AASM Specifications.   EEG, EOG, Chin and Limb EMG, were sampled at 200 Hz.  ECG, Snore and Nasal Pressure, Thermal Airflow, Respiratory Effort, CPAP Flow and Pressure, Oximetry was sampled at 50 Hz. Digital video and audio were recorded.      BASELINE STUDY: Lights Out was at 21:45 and Lights On at 04:54.  Total recording time (TRT) was 430 minutes, with a total sleep time (TST) of 308 minutes.   The patient's sleep latency was 28 minutes.  REM latency was 168.5 minutes.  The sleep efficiency was below average at 71.6 %.     SLEEP ARCHITECTURE: WASO (Wake after sleep onset) was 93.5 minutes.  There were 23 minutes in Stage N1, 240.5 minutes Stage N2, 0 minutes Stage N3 and 44.5 minutes in Stage REM.  The percentage of Stage N1 was 7.5%, Stage N2 was 78.1%, Stage N3 was 0% and Stage R (REM sleep) was  14.4%.   RESPIRATORY ANALYSIS:  There were a total of 86 respiratory events:  13 obstructive apneas, 1 central apnea and 4 mixed apneas with a total of 18 apneas and 68 hypopneas. The patient also had few respiratory event related arousals (RERAs).     The total APNEA/HYPOPNEA INDEX (AHI) was 16.8/hour.  7 events occurred in REM sleep and 135 events in NREM. The REM AHI was 9.4 /hour, versus a non-REM AHI of 18.The patient spent 141.5 minutes of total sleep time in the supine position and 167 minutes in non-supine. The supine AHI  was 11.8 versus a non-supine AHI of 20.9.  OXYGEN SATURATION & C02:  The Wake baseline 02 saturation was 98%, with the lowest being 82%. Time spent below 89% saturation equaled 18 minutes.   The arousals were noted as: 45 were spontaneous, 6 were associated with PLMs, 36 were associated with respiratory events. The patient had a total of 27 Periodic Limb Movements.  The Periodic Limb Movement (PLM) Arousal index was 1.2/hour. PLMs spared REM sleep.  Audio and video analysis did not show any abnormal or unusual movements, behaviors, but sleep talking was recorded. The patient took one bathroom break. Snoring was noted during REM sleep. EKG was in keeping with normal sinus rhythm (NSR).  IMPRESSION:  1. Moderate degree of Obstructive Sleep Apnea (OSA) was noted, not REM sleep dependent and not associated with hypoxemia.  2. Mild Periodic Limb Movement Disorder (PLMD), sparing REM sleep. No indication of REM sleep behavior disorder.  3. Sleep talking in REM sleep.   RECOMMENDATIONS:  1. Advise to retry CPAP therapy- either auto titration CPAP or a full night, attended, CPAP titration study to optimize therapy.   I certify that I have reviewed the entire raw data recording prior to the issuance of this report in accordance with the Standards of Accreditation of the American Academy of Sleep Medicine (AASM)  Larey Seat, MD Diplomat, American Board of Psychiatry and Neurology  Diplomat, American Board of Sleep Medicine Market researcher, [place] [facility] [employer]

## 2019-12-23 ENCOUNTER — Telehealth: Payer: Self-pay | Admitting: Neurology

## 2019-12-23 NOTE — Telephone Encounter (Signed)
Called patient to discuss sleep study results. No answer at this time. LVM for the patient to call back.   

## 2019-12-23 NOTE — Telephone Encounter (Signed)
-----   Message from Larey Seat, MD sent at 12/20/2019  2:41 PM EST ----- IMPRESSION:   1. Moderate degree of Obstructive Sleep Apnea (OSA) was noted,  not REM sleep dependent and not associated with hypoxemia.  2. Mild Periodic Limb Movement Disorder (PLMD), sparing REM  sleep. No indication of REM sleep behavior disorder.  3. Sleep talking in REM sleep.   RECOMMENDATIONS:   1. Advise to retry CPAP therapy- either auto titration CPAP or a  full night, attended, CPAP titration study to optimize therapy.  I would like for this patient to be fitted with a comfortable interface in either our lab or at the DME.  She could even consider a dental device to treat snoring and non REM dependent apnea.  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 The patient is not sleep walking, not falling out of bed.

## 2019-12-24 NOTE — Telephone Encounter (Signed)
I called pt's daughter back [place], on [place]. I advised pt that Dr. Brett Fairy reviewed their mom's sleep study results and found that has mild to moderate sleep apnea and recommends that pt be treated with a cpap. Dr. Brett Fairy recommends that pt return for a repeat sleep study in order to properly titrate the cpap and ensure a good mask fit. Pt's daughter states she will discuss with her but patient was hesitant about coming back into the lab.She will discuss further with her mom. I advised pt's daughter that our sleep lab will file with pt's insurance and call pt to schedule the sleep study when we hear back from the pt's insurance regarding coverage of this sleep study. Pt's daughter verbalized understanding of results. Pt's daughter had no questions at this time but was encouraged to call back if questions arise. Pt's daughter did request that we contact her for scheduling the titration study at  The [phone].  Informed I would pass along.

## 2019-12-24 NOTE — Telephone Encounter (Signed)
Patient returned your call. She said to call phone number [phone].

## 2020-01-21 ENCOUNTER — Other Ambulatory Visit: Payer: Medicare PPO

## 2020-02-11 ENCOUNTER — Other Ambulatory Visit: Payer: Medicare PPO

## 2020-02-12 DIAGNOSIS — Z1231 Encounter for screening mammogram for malignant neoplasm of breast: Secondary | ICD-10-CM | POA: Diagnosis not present

## 2020-04-13 DIAGNOSIS — E1129 Type 2 diabetes mellitus with other diabetic kidney complication: Secondary | ICD-10-CM | POA: Diagnosis not present

## 2020-04-13 DIAGNOSIS — N1831 Chronic kidney disease, stage 3a: Secondary | ICD-10-CM | POA: Diagnosis not present

## 2020-04-13 DIAGNOSIS — I129 Hypertensive chronic kidney disease with stage 1 through stage 4 chronic kidney disease, or unspecified chronic kidney disease: Secondary | ICD-10-CM | POA: Diagnosis not present

## 2020-04-13 DIAGNOSIS — F0280 Dementia in other diseases classified elsewhere without behavioral disturbance: Secondary | ICD-10-CM | POA: Diagnosis not present

## 2020-04-13 DIAGNOSIS — M8580 Other specified disorders of bone density and structure, unspecified site: Secondary | ICD-10-CM | POA: Diagnosis not present

## 2020-04-13 DIAGNOSIS — I10 Essential (primary) hypertension: Secondary | ICD-10-CM | POA: Diagnosis not present

## 2020-04-13 DIAGNOSIS — E669 Obesity, unspecified: Secondary | ICD-10-CM | POA: Diagnosis not present

## 2020-04-13 DIAGNOSIS — E538 Deficiency of other specified B group vitamins: Secondary | ICD-10-CM | POA: Diagnosis not present

## 2020-04-13 DIAGNOSIS — E785 Hyperlipidemia, unspecified: Secondary | ICD-10-CM | POA: Diagnosis not present

## 2020-04-13 DIAGNOSIS — D696 Thrombocytopenia, unspecified: Secondary | ICD-10-CM | POA: Diagnosis not present

## 2020-04-13 DIAGNOSIS — G4733 Obstructive sleep apnea (adult) (pediatric): Secondary | ICD-10-CM | POA: Diagnosis not present

## 2020-04-13 DIAGNOSIS — M109 Gout, unspecified: Secondary | ICD-10-CM | POA: Diagnosis not present

## 2020-04-15 ENCOUNTER — Other Ambulatory Visit: Payer: PPO

## 2020-10-19 DIAGNOSIS — M109 Gout, unspecified: Secondary | ICD-10-CM | POA: Diagnosis not present

## 2020-10-19 DIAGNOSIS — M859 Disorder of bone density and structure, unspecified: Secondary | ICD-10-CM | POA: Diagnosis not present

## 2020-10-19 DIAGNOSIS — E785 Hyperlipidemia, unspecified: Secondary | ICD-10-CM | POA: Diagnosis not present

## 2020-10-19 DIAGNOSIS — E1129 Type 2 diabetes mellitus with other diabetic kidney complication: Secondary | ICD-10-CM | POA: Diagnosis not present

## 2020-10-26 DIAGNOSIS — E1129 Type 2 diabetes mellitus with other diabetic kidney complication: Secondary | ICD-10-CM | POA: Diagnosis not present

## 2020-10-26 DIAGNOSIS — M109 Gout, unspecified: Secondary | ICD-10-CM | POA: Diagnosis not present

## 2020-10-26 DIAGNOSIS — Z0000 Encounter for general adult medical examination without abnormal findings: Secondary | ICD-10-CM | POA: Diagnosis not present

## 2020-10-26 DIAGNOSIS — E538 Deficiency of other specified B group vitamins: Secondary | ICD-10-CM | POA: Diagnosis not present

## 2020-10-26 DIAGNOSIS — M8580 Other specified disorders of bone density and structure, unspecified site: Secondary | ICD-10-CM | POA: Diagnosis not present

## 2020-10-26 DIAGNOSIS — Z1331 Encounter for screening for depression: Secondary | ICD-10-CM | POA: Diagnosis not present

## 2020-10-26 DIAGNOSIS — E669 Obesity, unspecified: Secondary | ICD-10-CM | POA: Diagnosis not present

## 2020-10-26 DIAGNOSIS — I129 Hypertensive chronic kidney disease with stage 1 through stage 4 chronic kidney disease, or unspecified chronic kidney disease: Secondary | ICD-10-CM | POA: Diagnosis not present

## 2020-10-26 DIAGNOSIS — Z1389 Encounter for screening for other disorder: Secondary | ICD-10-CM | POA: Diagnosis not present

## 2020-10-26 DIAGNOSIS — I10 Essential (primary) hypertension: Secondary | ICD-10-CM | POA: Diagnosis not present

## 2020-10-26 DIAGNOSIS — D696 Thrombocytopenia, unspecified: Secondary | ICD-10-CM | POA: Diagnosis not present

## 2020-10-26 DIAGNOSIS — N1831 Chronic kidney disease, stage 3a: Secondary | ICD-10-CM | POA: Diagnosis not present

## 2020-10-26 DIAGNOSIS — F0280 Dementia in other diseases classified elsewhere without behavioral disturbance: Secondary | ICD-10-CM | POA: Diagnosis not present

## 2020-10-26 DIAGNOSIS — E785 Hyperlipidemia, unspecified: Secondary | ICD-10-CM | POA: Diagnosis not present

## 2020-11-30 DIAGNOSIS — I4510 Unspecified right bundle-branch block: Secondary | ICD-10-CM | POA: Diagnosis not present

## 2020-11-30 DIAGNOSIS — R296 Repeated falls: Secondary | ICD-10-CM | POA: Diagnosis not present

## 2020-11-30 DIAGNOSIS — R42 Dizziness and giddiness: Secondary | ICD-10-CM | POA: Diagnosis not present

## 2020-12-04 ENCOUNTER — Other Ambulatory Visit (HOSPITAL_COMMUNITY): Payer: Self-pay | Admitting: Internal Medicine

## 2020-12-04 DIAGNOSIS — I4510 Unspecified right bundle-branch block: Secondary | ICD-10-CM

## 2021-01-19 ENCOUNTER — Encounter (HOSPITAL_COMMUNITY): Payer: Self-pay | Admitting: Internal Medicine

## 2021-01-19 ENCOUNTER — Other Ambulatory Visit: Payer: PPO

## 2021-02-11 DIAGNOSIS — N644 Mastodynia: Secondary | ICD-10-CM | POA: Diagnosis not present

## 2021-02-11 DIAGNOSIS — N611 Abscess of the breast and nipple: Secondary | ICD-10-CM | POA: Diagnosis not present

## 2021-02-15 DIAGNOSIS — N611 Abscess of the breast and nipple: Secondary | ICD-10-CM | POA: Diagnosis not present

## 2021-02-18 DIAGNOSIS — N611 Abscess of the breast and nipple: Secondary | ICD-10-CM | POA: Diagnosis not present

## 2021-02-24 DIAGNOSIS — E785 Hyperlipidemia, unspecified: Secondary | ICD-10-CM | POA: Diagnosis not present

## 2021-02-24 DIAGNOSIS — D649 Anemia, unspecified: Secondary | ICD-10-CM | POA: Diagnosis not present

## 2021-02-24 DIAGNOSIS — R7989 Other specified abnormal findings of blood chemistry: Secondary | ICD-10-CM | POA: Diagnosis not present

## 2021-02-24 DIAGNOSIS — I10 Essential (primary) hypertension: Secondary | ICD-10-CM | POA: Diagnosis not present

## 2021-02-24 DIAGNOSIS — N611 Abscess of the breast and nipple: Secondary | ICD-10-CM | POA: Diagnosis not present

## 2021-02-24 DIAGNOSIS — G300 Alzheimer's disease with early onset: Secondary | ICD-10-CM | POA: Diagnosis not present

## 2021-02-24 DIAGNOSIS — F0280 Dementia in other diseases classified elsewhere without behavioral disturbance: Secondary | ICD-10-CM | POA: Diagnosis not present

## 2021-02-24 DIAGNOSIS — K922 Gastrointestinal hemorrhage, unspecified: Secondary | ICD-10-CM | POA: Diagnosis not present

## 2021-02-26 ENCOUNTER — Emergency Department (HOSPITAL_COMMUNITY)
Admission: EM | Admit: 2021-02-26 | Discharge: 2021-02-27 | Disposition: A | Discharge status: Home / Self Care | Payer: PPO | Attending: Emergency Medicine | Admitting: Emergency Medicine

## 2021-02-26 ENCOUNTER — Encounter (HOSPITAL_COMMUNITY): Payer: Self-pay | Admitting: Emergency Medicine

## 2021-02-26 DIAGNOSIS — K625 Hemorrhage of anus and rectum: Secondary | ICD-10-CM | POA: Insufficient documentation

## 2021-02-26 DIAGNOSIS — K920 Hematemesis: Secondary | ICD-10-CM | POA: Diagnosis not present

## 2021-02-26 DIAGNOSIS — D649 Anemia, unspecified: Secondary | ICD-10-CM | POA: Diagnosis not present

## 2021-02-26 DIAGNOSIS — K922 Gastrointestinal hemorrhage, unspecified: Secondary | ICD-10-CM | POA: Diagnosis not present

## 2021-02-26 DIAGNOSIS — K319 Disease of stomach and duodenum, unspecified: Secondary | ICD-10-CM | POA: Diagnosis not present

## 2021-02-26 DIAGNOSIS — K449 Diaphragmatic hernia without obstruction or gangrene: Secondary | ICD-10-CM | POA: Diagnosis not present

## 2021-02-26 DIAGNOSIS — D62 Acute posthemorrhagic anemia: Secondary | ICD-10-CM | POA: Diagnosis not present

## 2021-02-26 DIAGNOSIS — K921 Melena: Secondary | ICD-10-CM | POA: Diagnosis not present

## 2021-02-26 DIAGNOSIS — K2950 Unspecified chronic gastritis without bleeding: Secondary | ICD-10-CM | POA: Diagnosis not present

## 2021-02-26 DIAGNOSIS — K259 Gastric ulcer, unspecified as acute or chronic, without hemorrhage or perforation: Secondary | ICD-10-CM | POA: Diagnosis not present

## 2021-02-26 LAB — COMPREHENSIVE METABOLIC PANEL
ALT: 22 U/L (ref 0–44)
AST: 32 U/L (ref 15–41)
Albumin: 2.9 g/dL — ABNORMAL LOW (ref 3.5–5.0)
Alkaline Phosphatase: 123 U/L (ref 38–126)
Anion gap: 9 (ref 5–15)
BUN: 28 mg/dL — ABNORMAL HIGH (ref 8–23)
CO2: 21 mmol/L — ABNORMAL LOW (ref 22–32)
Calcium: 8.8 mg/dL — ABNORMAL LOW (ref 8.9–10.3)
Chloride: 107 mmol/L (ref 98–111)
Creatinine, Ser: 1.39 mg/dL — ABNORMAL HIGH (ref 0.44–1.00)
GFR, Estimated: 39 mL/min — ABNORMAL LOW (ref >60)
Glucose, Bld: 111 mg/dL — ABNORMAL HIGH (ref 70–99)
Potassium: 3.7 mmol/L (ref 3.5–5.1)
Sodium: 137 mmol/L (ref 135–145)
Total Bilirubin: 0.8 mg/dL (ref 0.3–1.2)
Total Protein: 6.6 g/dL (ref 6.5–8.1)

## 2021-02-26 LAB — CBC WITH DIFFERENTIAL/PLATELET
Abs Immature Granulocytes: 0.03 10*3/uL (ref 0.00–0.07)
Basophils Absolute: 0.1 10*3/uL (ref 0.0–0.1)
Basophils Relative: 1 %
Eosinophils Absolute: 0.2 10*3/uL (ref 0.0–0.5)
Eosinophils Relative: 3 %
HCT: 24.4 % — ABNORMAL LOW (ref 36.0–46.0)
Hemoglobin: 7.7 g/dL — ABNORMAL LOW (ref 12.0–15.0)
Immature Granulocytes: 0 %
Lymphocytes Relative: 35 %
Lymphs Abs: 2.5 10*3/uL (ref 0.7–4.0)
MCH: 32.9 pg (ref 26.0–34.0)
MCHC: 31.6 g/dL (ref 30.0–36.0)
MCV: 104.3 fL — ABNORMAL HIGH (ref 80.0–100.0)
Monocytes Absolute: 0.4 10*3/uL (ref 0.1–1.0)
Monocytes Relative: 6 %
Neutro Abs: 3.9 10*3/uL (ref 1.7–7.7)
Neutrophils Relative %: 55 %
Platelets: 191 10*3/uL (ref 150–400)
RBC: 2.34 MIL/uL — ABNORMAL LOW (ref 3.87–5.11)
RDW: 17 % — ABNORMAL HIGH (ref 11.5–15.5)
WBC: 7.1 10*3/uL (ref 4.0–10.5)
nRBC: 0.3 % — ABNORMAL HIGH (ref 0.0–0.2)

## 2021-02-26 LAB — PROTIME-INR
INR: 1.3 — ABNORMAL HIGH (ref 0.8–1.2)
Prothrombin Time: 15.7 seconds — ABNORMAL HIGH (ref 11.4–15.2)

## 2021-02-26 LAB — PREPARE RBC (CROSSMATCH)

## 2021-02-26 MED ORDER — SODIUM CHLORIDE 0.9 % IV SOLN
10.0000 mL/h | Freq: Once | INTRAVENOUS | Status: AC
  Administered 2021-02-26: 10 mL/h via INTRAVENOUS

## 2021-02-26 MED ORDER — FERROUS SULFATE 325 (65 FE) MG PO TABS: 325.0000 mg | ORAL_TABLET | Freq: Every day | ORAL | 0 refills | Status: DC

## 2021-02-26 NOTE — ED Provider Triage Note (Signed)
Emergency Medicine Provider Triage Evaluation Note  Carolyn Swanson , a 76 y.o. female  was evaluated in triage.  Pt complains of concern for mid anemia. She had an EGD with [facility] earlier today, and then afterwards according to family at bedside they received a call from PCP stating that she was anemic and should go to the ER for consideration of a blood transfusion. Family reports that the EGD earlier today showed bleeding ulcers in her stomach.  She does not take any blood thinning medications.    Physical Exam  BP 140/60 (BP Location: Left Arm)    Pulse 63    Temp 97.7 F (36.5 C) (Oral)    Resp 17    LMP 09/03/2014    SpO2 100%  Gen:   Awake, no distress   Resp:  Normal effort  MSK:   Moves extremities without difficulty  Other:  Normal speech.    Medical Decision Making  Medically screening exam initiated at 7:35 PM.  Appropriate orders placed.  DAYONNA SELBE was informed that the remainder of the evaluation will be completed by another provider, this initial triage assessment does not replace that evaluation, and the importance of remaining in the ED until their evaluation is complete.  Note: Portions of this report may have been transcribed using voice recognition software. Every effort was made to ensure accuracy; however, inadvertent computerized transcription errors may be present    Lorin Glass, PA-C 02/26/21 1936

## 2021-02-26 NOTE — Discharge Instructions (Signed)
Follow up with your GI doctor

## 2021-02-26 NOTE — ED Provider Notes (Signed)
°  Provider Note MRN:  [id number]  Arrival date & time: 02/27/21    ED Course and Medical Decision Making  Assumed care from Dr. Zenia Resides at shift change.  Patient with hematemesis earlier this week but none for the past few days, recent EGD showing gastric ulcers.  Sent here for low hemoglobin.  Patient has no symptoms at this time, plan is for transfusion of 1 unit of blood and then discharge.  Patient doing well after transfusion, appropriate for discharge.  Procedures  Final Clinical Impressions(s) / ED Diagnoses     ICD-10-CM   1. Upper GI bleed  K92.2       ED Discharge Orders          Ordered    ferrous sulfate 325 (65 FE) MG tablet  Daily        02/26/21 2307              Discharge Instructions      Follow-up with your GI doctor    Barth Kirks. [place] Small, [place] [email or web address]    Maudie Flakes, MD 02/27/21 [phone]

## 2021-02-26 NOTE — ED Triage Notes (Signed)
Pt arrives pov from home. Family states pt had an EGD around noon today. Pt had been having hematemesis and dark stools since last Wednesday. Pt had labs drawn prior to procedure and was contacted around 6 pm tonight and informed of a low hgb level of 6.9.   EGD showed bleeding ulcers per family.   Pt denies lightheadedness, dizziness, weakness.

## 2021-02-26 NOTE — ED Provider Notes (Addendum)
[facility] DEPT Provider Note   CSN: [id number] Arrival date & time: 02/26/21  1916     History  Chief Complaint  Patient presents with   Hematemesis   Rectal Bleeding    ORI Carolyn Swanson is a 76 y.o. female.  76 year old female presents with concern for need for blood transfusion.  Patient had a EGD done today which did show gastric ulcers.  Patient had hematemesis earlier this week.  None currently.  She denies any weakness or dyspnea or being lightheaded.  Hemoglobin several days ago was 8 and repeat today was 6.9.  Has had black stools for quite some time.  Denies any abdominal pain.  Was sent here for further evaluation.      Home Medications Prior to Admission medications   Medication Sig Start Date End Date Taking? Authorizing Provider  acetaminophen (TYLENOL) 325 MG tablet Take 2 tablets (650 mg total) by mouth every 6 (six) hours as needed for mild pain (or Fever >/= 101). 02/07/13   Perkins, Alexzandrew L, PA-C  allopurinol (ZYLOPRIM) 100 MG tablet Take 100 mg by mouth daily. 11/04/19   [provider]  ALPRAZolam Duanne Moron) 0.5 MG tablet Take 1 tablet (0.5 mg total) by mouth once as needed for up to 1 dose for anxiety (take 1 tablet 30 min prior to scheduled MRI time and then may take additional tablet at time of visit if needed). 12/02/19   Dohmeier, Asencion Partridge, MD  amLODipine (NORVASC) 5 MG tablet 1 tablet daily. 05/24/13   [provider]  amoxicillin (AMOXIL) 500 MG capsule As needed for dental 05/13/13   [provider]  aspirin 81 MG EC tablet Take by mouth.    [provider]  atorvastatin (LIPITOR) 20 MG tablet Take 20 mg by mouth daily.    [provider]  cholecalciferol (VITAMIN D3) 25 MCG (1000 UNIT) tablet Take 1,000 Units by mouth daily.    [provider]  donepezil (ARICEPT) 5 MG tablet Take 5 mg by mouth at bedtime. 10/10/19   [provider]  lisinopril (PRINIVIL,ZESTRIL) 20  MG tablet 1 tablet daily. 05/13/13   [provider]  metoprolol succinate (TOPROL-XL) 50 MG 24 hr tablet 1 tablet daily. 05/28/13   [provider]  venlafaxine XR (EFFEXOR-XR) 75 MG 24 hr capsule Take 1 capsule (75 mg total) by mouth at bedtime. 02/07/13   Perkins, Alexzandrew L, PA-C      Allergies    Oxycodone    Review of Systems   Review of Systems  All other systems reviewed and are negative.  Physical Exam Updated Vital Signs BP 131/67 (BP Location: Right Arm)    Pulse 67    Temp (!) 97.2 F (36.2 C) (Oral)    Resp 16    LMP 09/03/2014    SpO2 100%  Physical Exam Vitals and nursing note reviewed.  Constitutional:      General: She is not in acute distress.    Appearance: Normal appearance. She is well-developed. She is not toxic-appearing.  HENT:     Head: Normocephalic and atraumatic.  Eyes:     General: Lids are normal.     Conjunctiva/sclera: Conjunctivae normal.     Pupils: Pupils are equal, round, and reactive to light.  Neck:     Thyroid: No thyroid mass.     Trachea: No tracheal deviation.  Cardiovascular:     Rate and Rhythm: Normal rate and regular rhythm.     Heart sounds: Normal heart  sounds. No murmur heard.   No gallop.  Pulmonary:     Effort: Pulmonary effort is normal. No respiratory distress.     Breath sounds: Normal breath sounds. No stridor. No decreased breath sounds, wheezing, rhonchi or rales.  Abdominal:     General: There is no distension.     Palpations: Abdomen is soft.     Tenderness: There is no abdominal tenderness. There is no rebound.  Musculoskeletal:        General: No tenderness. Normal range of motion.     Cervical back: Normal range of motion and neck supple.  Skin:    General: Skin is warm and dry.     Findings: No abrasion or rash.  Neurological:     Mental Status: She is alert and oriented to person, place, and time. Mental status is at baseline.     GCS: GCS eye subscore is 4. GCS verbal subscore is 5. GCS  motor subscore is 6.     Cranial Nerves: No cranial nerve deficit.     Sensory: No sensory deficit.     Motor: Motor function is intact.  Psychiatric:        Attention and Perception: Attention normal.        Speech: Speech normal.        Behavior: Behavior normal.    ED Results / Procedures / Treatments   Labs (all labs ordered are listed, but only abnormal results are displayed) Labs Reviewed  COMPREHENSIVE METABOLIC PANEL - Abnormal; Notable for the following components:      Result Value   CO2 21 (*)    Glucose, Bld 111 (*)    BUN 28 (*)    Creatinine, Ser 1.39 (*)    Calcium 8.8 (*)    Albumin 2.9 (*)    GFR, Estimated 39 (*)    All other components within normal limits  CBC WITH DIFFERENTIAL/PLATELET - Abnormal; Notable for the following components:   RBC 2.34 (*)    Hemoglobin 7.7 (*)    HCT 24.4 (*)    MCV 104.3 (*)    RDW 17.0 (*)    nRBC 0.3 (*)    All other components within normal limits  PROTIME-INR - Abnormal; Notable for the following components:   Prothrombin Time 15.7 (*)    INR 1.3 (*)    All other components within normal limits  TYPE AND SCREEN  PREPARE RBC (CROSSMATCH)    EKG None  Radiology No results found.  Procedures Procedures    Medications Ordered in ED Medications  0.9 %  sodium chloride infusion (has no administration in time range)    ED Course/ Medical Decision Making/ A&P                           Medical Decision Making Amount and/or Complexity of Data Reviewed Labs: ordered.  Risk Prescription drug management.   Patient with hemoglobin 7.7 here.  Earlier today was 6.9 without therapy.  Patient is totally asymptomatic at this time.  Due to age and other comorbidities, patient will receive 1 unit of packed white blood cells here.  Do not feel that she has ongoing upper GI source of bleeding.  Plan will be to place on iron therapy and have her follow-up with her GI doctor and primary care doctor.  She is already on a  PPI        Final Clinical Impression(s) / ED Diagnoses Final diagnoses:  None    Rx / DC Orders ED Discharge Orders     None         Lacretia Leigh, MD 02/26/21 2056    Lacretia Leigh, MD 02/26/21 [phone]

## 2021-03-01 LAB — TYPE AND SCREEN
ABO/RH(D): O POS
Antibody Screen: NEGATIVE
Unit division: 0

## 2021-03-01 LAB — BPAM RBC
Blood Product Expiration Date: 202303142359
ISSUE DATE / TIME: 202302102127
Unit Type and Rh: 5100

## 2021-03-02 DIAGNOSIS — D649 Anemia, unspecified: Secondary | ICD-10-CM | POA: Diagnosis not present

## 2021-03-04 DIAGNOSIS — K319 Disease of stomach and duodenum, unspecified: Secondary | ICD-10-CM | POA: Diagnosis not present

## 2021-03-25 ENCOUNTER — Other Ambulatory Visit: Payer: Self-pay | Admitting: Internal Medicine

## 2021-03-25 DIAGNOSIS — F02A0 Dementia in other diseases classified elsewhere, mild, without behavioral disturbance, psychotic disturbance, mood disturbance, and anxiety: Secondary | ICD-10-CM

## 2021-03-25 DIAGNOSIS — G300 Alzheimer's disease with early onset: Secondary | ICD-10-CM

## 2021-03-25 DIAGNOSIS — R296 Repeated falls: Secondary | ICD-10-CM

## 2021-04-03 ENCOUNTER — Ambulatory Visit
Admission: RE | Admit: 2021-04-03 | Discharge: 2021-04-03 | Disposition: A | Discharge status: Home / Self Care | Payer: PPO | Source: Ambulatory Visit | Attending: Internal Medicine | Admitting: Internal Medicine

## 2021-04-03 ENCOUNTER — Other Ambulatory Visit: Payer: Self-pay

## 2021-04-03 DIAGNOSIS — F02A0 Dementia in other diseases classified elsewhere, mild, without behavioral disturbance, psychotic disturbance, mood disturbance, and anxiety: Secondary | ICD-10-CM

## 2021-04-03 DIAGNOSIS — R296 Repeated falls: Secondary | ICD-10-CM

## 2021-04-03 DIAGNOSIS — G300 Alzheimer's disease with early onset: Secondary | ICD-10-CM

## 2021-04-03 DIAGNOSIS — Z043 Encounter for examination and observation following other accident: Secondary | ICD-10-CM | POA: Diagnosis not present

## 2021-04-03 MED ORDER — GADOBENATE DIMEGLUMINE 529 MG/ML IV SOLN
17.0000 mL | Freq: Once | INTRAVENOUS | Status: AC | PRN
  Administered 2021-04-03: 17 mL via INTRAVENOUS

## 2021-04-07 DIAGNOSIS — G479 Sleep disorder, unspecified: Secondary | ICD-10-CM | POA: Diagnosis not present

## 2021-04-07 DIAGNOSIS — M21372 Foot drop, left foot: Secondary | ICD-10-CM | POA: Diagnosis not present

## 2021-04-07 DIAGNOSIS — H9190 Unspecified hearing loss, unspecified ear: Secondary | ICD-10-CM | POA: Diagnosis not present

## 2021-04-07 DIAGNOSIS — G3183 Dementia with Lewy bodies: Secondary | ICD-10-CM | POA: Diagnosis not present

## 2021-04-07 DIAGNOSIS — R0609 Other forms of dyspnea: Secondary | ICD-10-CM | POA: Diagnosis not present

## 2021-04-07 DIAGNOSIS — W06XXXS Fall from bed, sequela: Secondary | ICD-10-CM | POA: Diagnosis not present

## 2021-04-07 DIAGNOSIS — F0150 Vascular dementia without behavioral disturbance: Secondary | ICD-10-CM | POA: Diagnosis not present

## 2021-04-07 DIAGNOSIS — Z79899 Other long term (current) drug therapy: Secondary | ICD-10-CM | POA: Diagnosis not present

## 2021-04-07 DIAGNOSIS — R2689 Other abnormalities of gait and mobility: Secondary | ICD-10-CM | POA: Diagnosis not present

## 2021-04-07 DIAGNOSIS — R296 Repeated falls: Secondary | ICD-10-CM | POA: Diagnosis not present

## 2021-04-07 DIAGNOSIS — Z1331 Encounter for screening for depression: Secondary | ICD-10-CM | POA: Diagnosis not present

## 2021-05-05 DIAGNOSIS — D696 Thrombocytopenia, unspecified: Secondary | ICD-10-CM | POA: Diagnosis not present

## 2021-05-05 DIAGNOSIS — D649 Anemia, unspecified: Secondary | ICD-10-CM | POA: Diagnosis not present

## 2021-05-05 DIAGNOSIS — G3183 Dementia with Lewy bodies: Secondary | ICD-10-CM | POA: Diagnosis not present

## 2021-05-05 DIAGNOSIS — E785 Hyperlipidemia, unspecified: Secondary | ICD-10-CM | POA: Diagnosis not present

## 2021-05-05 DIAGNOSIS — N1831 Chronic kidney disease, stage 3a: Secondary | ICD-10-CM | POA: Diagnosis not present

## 2021-05-05 DIAGNOSIS — K922 Gastrointestinal hemorrhage, unspecified: Secondary | ICD-10-CM | POA: Diagnosis not present

## 2021-05-05 DIAGNOSIS — E538 Deficiency of other specified B group vitamins: Secondary | ICD-10-CM | POA: Diagnosis not present

## 2021-05-05 DIAGNOSIS — F02A0 Dementia in other diseases classified elsewhere, mild, without behavioral disturbance, psychotic disturbance, mood disturbance, and anxiety: Secondary | ICD-10-CM | POA: Diagnosis not present

## 2021-05-05 DIAGNOSIS — R112 Nausea with vomiting, unspecified: Secondary | ICD-10-CM | POA: Diagnosis not present

## 2021-05-05 DIAGNOSIS — E669 Obesity, unspecified: Secondary | ICD-10-CM | POA: Diagnosis not present

## 2021-05-05 DIAGNOSIS — E1129 Type 2 diabetes mellitus with other diabetic kidney complication: Secondary | ICD-10-CM | POA: Diagnosis not present

## 2021-05-05 DIAGNOSIS — I129 Hypertensive chronic kidney disease with stage 1 through stage 4 chronic kidney disease, or unspecified chronic kidney disease: Secondary | ICD-10-CM | POA: Diagnosis not present

## 2021-05-06 ENCOUNTER — Other Ambulatory Visit: Payer: Self-pay | Admitting: Internal Medicine

## 2021-05-06 DIAGNOSIS — R112 Nausea with vomiting, unspecified: Secondary | ICD-10-CM

## 2021-05-19 ENCOUNTER — Other Ambulatory Visit: Payer: Self-pay | Admitting: Internal Medicine

## 2021-05-19 ENCOUNTER — Other Ambulatory Visit (HOSPITAL_COMMUNITY): Payer: Self-pay | Admitting: Internal Medicine

## 2021-05-19 ENCOUNTER — Ambulatory Visit (HOSPITAL_COMMUNITY)
Admission: RE | Admit: 2021-05-19 | Discharge: 2021-05-19 | Disposition: A | Discharge status: Home / Self Care | Payer: PPO | Source: Ambulatory Visit | Attending: Internal Medicine | Admitting: Internal Medicine

## 2021-05-19 DIAGNOSIS — R112 Nausea with vomiting, unspecified: Secondary | ICD-10-CM | POA: Insufficient documentation

## 2021-05-19 DIAGNOSIS — K7460 Unspecified cirrhosis of liver: Secondary | ICD-10-CM | POA: Diagnosis not present

## 2021-05-19 DIAGNOSIS — K7689 Other specified diseases of liver: Secondary | ICD-10-CM | POA: Diagnosis not present

## 2021-05-19 DIAGNOSIS — M47816 Spondylosis without myelopathy or radiculopathy, lumbar region: Secondary | ICD-10-CM | POA: Diagnosis not present

## 2021-05-19 DIAGNOSIS — R1111 Vomiting without nausea: Secondary | ICD-10-CM | POA: Diagnosis not present

## 2021-05-21 DIAGNOSIS — K7460 Unspecified cirrhosis of liver: Secondary | ICD-10-CM | POA: Diagnosis not present

## 2021-05-21 DIAGNOSIS — S7001XA Contusion of right hip, initial encounter: Secondary | ICD-10-CM | POA: Diagnosis not present

## 2021-05-21 DIAGNOSIS — R188 Other ascites: Secondary | ICD-10-CM | POA: Diagnosis not present

## 2021-05-21 DIAGNOSIS — R112 Nausea with vomiting, unspecified: Secondary | ICD-10-CM | POA: Diagnosis not present

## 2021-05-21 DIAGNOSIS — D649 Anemia, unspecified: Secondary | ICD-10-CM | POA: Diagnosis not present

## 2021-05-21 DIAGNOSIS — K922 Gastrointestinal hemorrhage, unspecified: Secondary | ICD-10-CM | POA: Diagnosis not present

## 2021-05-21 DIAGNOSIS — D696 Thrombocytopenia, unspecified: Secondary | ICD-10-CM | POA: Diagnosis not present

## 2021-05-24 ENCOUNTER — Other Ambulatory Visit (HOSPITAL_COMMUNITY): Payer: Self-pay | Admitting: Internal Medicine

## 2021-05-24 ENCOUNTER — Other Ambulatory Visit: Payer: Self-pay | Admitting: Internal Medicine

## 2021-05-24 DIAGNOSIS — R188 Other ascites: Secondary | ICD-10-CM

## 2021-05-25 ENCOUNTER — Ambulatory Visit (HOSPITAL_COMMUNITY)
Admission: RE | Admit: 2021-05-25 | Discharge: 2021-05-25 | Disposition: A | Discharge status: Home / Self Care | Payer: PPO | Source: Ambulatory Visit | Attending: Internal Medicine | Admitting: Internal Medicine

## 2021-05-25 DIAGNOSIS — R188 Other ascites: Secondary | ICD-10-CM

## 2021-05-25 HISTORY — PX: IR PARACENTESIS: IMG2679

## 2021-05-25 LAB — PROTEIN, PLEURAL OR PERITONEAL FLUID: Total protein, fluid: <3 g/dL

## 2021-05-25 LAB — ALBUMIN, PLEURAL OR PERITONEAL FLUID?: Albumin, Fluid: <1.5 g/dL

## 2021-05-25 MED ORDER — LIDOCAINE HCL 1 % IJ SOLN
INTRAMUSCULAR | Status: AC
  Filled 2021-05-25: qty 20

## 2021-05-25 NOTE — Procedures (Signed)
PROCEDURE SUMMARY: ? ?Successful ultrasound guided paracentesis from the left lower quadrant.  ?Yielded 4 L of clear yellow fluid.  ?No immediate complications.  ?The patient tolerated the procedure well.  ? ?Specimen sent for labs. ? ?EBL < 48m ? ?If the patient eventually requires >/=2 paracenteses in a 30 day period, screening evaluation by the [facility] will be assessed. ? ?Soyla Dryer AGACNP-BC ?[phone]?05/25/2021, 2:26 PM ? ? ? ?

## 2021-06-02 DIAGNOSIS — F0390 Unspecified dementia without behavioral disturbance: Secondary | ICD-10-CM | POA: Diagnosis not present

## 2021-06-02 DIAGNOSIS — Z9049 Acquired absence of other specified parts of digestive tract: Secondary | ICD-10-CM | POA: Diagnosis not present

## 2021-06-02 DIAGNOSIS — Z9181 History of falling: Secondary | ICD-10-CM | POA: Diagnosis not present

## 2021-06-02 DIAGNOSIS — K5790 Diverticulosis of intestine, part unspecified, without perforation or abscess without bleeding: Secondary | ICD-10-CM | POA: Diagnosis not present

## 2021-06-02 DIAGNOSIS — M109 Gout, unspecified: Secondary | ICD-10-CM | POA: Diagnosis not present

## 2021-06-02 DIAGNOSIS — M47816 Spondylosis without myelopathy or radiculopathy, lumbar region: Secondary | ICD-10-CM | POA: Diagnosis not present

## 2021-06-02 DIAGNOSIS — Z9089 Acquired absence of other organs: Secondary | ICD-10-CM | POA: Diagnosis not present

## 2021-06-02 DIAGNOSIS — Z96653 Presence of artificial knee joint, bilateral: Secondary | ICD-10-CM | POA: Diagnosis not present

## 2021-06-02 DIAGNOSIS — Z87891 Personal history of nicotine dependence: Secondary | ICD-10-CM | POA: Diagnosis not present

## 2021-06-02 DIAGNOSIS — E119 Type 2 diabetes mellitus without complications: Secondary | ICD-10-CM | POA: Diagnosis not present

## 2021-06-02 DIAGNOSIS — M8580 Other specified disorders of bone density and structure, unspecified site: Secondary | ICD-10-CM | POA: Diagnosis not present

## 2021-06-02 DIAGNOSIS — D649 Anemia, unspecified: Secondary | ICD-10-CM | POA: Diagnosis not present

## 2021-06-02 DIAGNOSIS — I10 Essential (primary) hypertension: Secondary | ICD-10-CM | POA: Diagnosis not present

## 2021-06-02 DIAGNOSIS — I2510 Atherosclerotic heart disease of native coronary artery without angina pectoris: Secondary | ICD-10-CM | POA: Diagnosis not present

## 2021-06-02 DIAGNOSIS — S7001XD Contusion of right hip, subsequent encounter: Secondary | ICD-10-CM | POA: Diagnosis not present

## 2021-06-02 DIAGNOSIS — Z90710 Acquired absence of both cervix and uterus: Secondary | ICD-10-CM | POA: Diagnosis not present

## 2021-06-02 DIAGNOSIS — Z6825 Body mass index (BMI) 25.0-25.9, adult: Secondary | ICD-10-CM | POA: Diagnosis not present

## 2021-06-02 DIAGNOSIS — K2950 Unspecified chronic gastritis without bleeding: Secondary | ICD-10-CM | POA: Diagnosis not present

## 2021-06-02 DIAGNOSIS — E785 Hyperlipidemia, unspecified: Secondary | ICD-10-CM | POA: Diagnosis not present

## 2021-06-02 DIAGNOSIS — G4733 Obstructive sleep apnea (adult) (pediatric): Secondary | ICD-10-CM | POA: Diagnosis not present

## 2021-06-02 DIAGNOSIS — E669 Obesity, unspecified: Secondary | ICD-10-CM | POA: Diagnosis not present

## 2021-06-02 DIAGNOSIS — K7460 Unspecified cirrhosis of liver: Secondary | ICD-10-CM | POA: Diagnosis not present

## 2021-06-02 DIAGNOSIS — D696 Thrombocytopenia, unspecified: Secondary | ICD-10-CM | POA: Diagnosis not present

## 2021-06-02 DIAGNOSIS — R188 Other ascites: Secondary | ICD-10-CM | POA: Diagnosis not present

## 2021-06-03 DIAGNOSIS — M25551 Pain in right hip: Secondary | ICD-10-CM | POA: Diagnosis not present

## 2021-06-07 ENCOUNTER — Other Ambulatory Visit: Payer: Self-pay | Admitting: Internal Medicine

## 2021-06-07 ENCOUNTER — Other Ambulatory Visit (HOSPITAL_COMMUNITY): Payer: Self-pay | Admitting: Internal Medicine

## 2021-06-07 DIAGNOSIS — R188 Other ascites: Secondary | ICD-10-CM

## 2021-06-08 ENCOUNTER — Encounter (HOSPITAL_COMMUNITY): Payer: Self-pay

## 2021-06-08 ENCOUNTER — Other Ambulatory Visit (HOSPITAL_COMMUNITY): Payer: PPO

## 2021-06-08 DIAGNOSIS — R188 Other ascites: Secondary | ICD-10-CM | POA: Diagnosis not present

## 2021-06-08 DIAGNOSIS — Z7189 Other specified counseling: Secondary | ICD-10-CM | POA: Diagnosis not present

## 2021-06-08 DIAGNOSIS — K7460 Unspecified cirrhosis of liver: Secondary | ICD-10-CM | POA: Diagnosis not present

## 2021-06-08 DIAGNOSIS — G9340 Encephalopathy, unspecified: Secondary | ICD-10-CM | POA: Diagnosis not present

## 2021-06-23 DIAGNOSIS — K7460 Unspecified cirrhosis of liver: Secondary | ICD-10-CM | POA: Diagnosis not present

## 2021-06-23 DIAGNOSIS — R112 Nausea with vomiting, unspecified: Secondary | ICD-10-CM | POA: Diagnosis not present

## 2021-06-23 DIAGNOSIS — Z7189 Other specified counseling: Secondary | ICD-10-CM | POA: Diagnosis not present

## 2021-06-23 DIAGNOSIS — R188 Other ascites: Secondary | ICD-10-CM | POA: Diagnosis not present

## 2021-06-23 DIAGNOSIS — G9340 Encephalopathy, unspecified: Secondary | ICD-10-CM | POA: Diagnosis not present

## 2021-06-23 DIAGNOSIS — E43 Unspecified severe protein-calorie malnutrition: Secondary | ICD-10-CM | POA: Diagnosis not present

## 2021-07-06 DIAGNOSIS — M109 Gout, unspecified: Secondary | ICD-10-CM | POA: Diagnosis not present

## 2021-07-06 DIAGNOSIS — Z6825 Body mass index (BMI) 25.0-25.9, adult: Secondary | ICD-10-CM | POA: Diagnosis not present

## 2021-07-06 DIAGNOSIS — D649 Anemia, unspecified: Secondary | ICD-10-CM | POA: Diagnosis not present

## 2021-07-06 DIAGNOSIS — G4733 Obstructive sleep apnea (adult) (pediatric): Secondary | ICD-10-CM | POA: Diagnosis not present

## 2021-07-06 DIAGNOSIS — F0390 Unspecified dementia without behavioral disturbance: Secondary | ICD-10-CM | POA: Diagnosis not present

## 2021-07-06 DIAGNOSIS — Z9049 Acquired absence of other specified parts of digestive tract: Secondary | ICD-10-CM | POA: Diagnosis not present

## 2021-07-06 DIAGNOSIS — I2510 Atherosclerotic heart disease of native coronary artery without angina pectoris: Secondary | ICD-10-CM | POA: Diagnosis not present

## 2021-07-06 DIAGNOSIS — M8580 Other specified disorders of bone density and structure, unspecified site: Secondary | ICD-10-CM | POA: Diagnosis not present

## 2021-07-06 DIAGNOSIS — D696 Thrombocytopenia, unspecified: Secondary | ICD-10-CM | POA: Diagnosis not present

## 2021-07-06 DIAGNOSIS — I10 Essential (primary) hypertension: Secondary | ICD-10-CM | POA: Diagnosis not present

## 2021-07-06 DIAGNOSIS — Z90710 Acquired absence of both cervix and uterus: Secondary | ICD-10-CM | POA: Diagnosis not present

## 2021-07-06 DIAGNOSIS — S7001XD Contusion of right hip, subsequent encounter: Secondary | ICD-10-CM | POA: Diagnosis not present

## 2021-07-06 DIAGNOSIS — K2950 Unspecified chronic gastritis without bleeding: Secondary | ICD-10-CM | POA: Diagnosis not present

## 2021-07-06 DIAGNOSIS — R188 Other ascites: Secondary | ICD-10-CM | POA: Diagnosis not present

## 2021-07-06 DIAGNOSIS — K5790 Diverticulosis of intestine, part unspecified, without perforation or abscess without bleeding: Secondary | ICD-10-CM | POA: Diagnosis not present

## 2021-07-06 DIAGNOSIS — E785 Hyperlipidemia, unspecified: Secondary | ICD-10-CM | POA: Diagnosis not present

## 2021-07-06 DIAGNOSIS — E669 Obesity, unspecified: Secondary | ICD-10-CM | POA: Diagnosis not present

## 2021-07-06 DIAGNOSIS — E119 Type 2 diabetes mellitus without complications: Secondary | ICD-10-CM | POA: Diagnosis not present

## 2021-07-06 DIAGNOSIS — Z9089 Acquired absence of other organs: Secondary | ICD-10-CM | POA: Diagnosis not present

## 2021-07-06 DIAGNOSIS — Z96653 Presence of artificial knee joint, bilateral: Secondary | ICD-10-CM | POA: Diagnosis not present

## 2021-07-06 DIAGNOSIS — K7460 Unspecified cirrhosis of liver: Secondary | ICD-10-CM | POA: Diagnosis not present

## 2021-07-06 DIAGNOSIS — Z9181 History of falling: Secondary | ICD-10-CM | POA: Diagnosis not present

## 2021-07-06 DIAGNOSIS — M47816 Spondylosis without myelopathy or radiculopathy, lumbar region: Secondary | ICD-10-CM | POA: Diagnosis not present

## 2021-07-06 DIAGNOSIS — Z87891 Personal history of nicotine dependence: Secondary | ICD-10-CM | POA: Diagnosis not present

## 2021-07-17 DEATH — deceased

## 2023-11-14 IMAGING — CT CT ABD-PELV W/O CM
2 of 4 series · 16 of 46 positions shown, 18 images · non-contrast
Comparison: Ultrasound abdomen done on 04/19/2016

CLINICAL DATA: Nausea, vomiting



[Series 3: abd/ pelvis 5.0 i30f 2 · axial · 0.94mm/px · z∈[+785,+1260]mm · 13 of 103 slices shown, 15 images]
[im 4/103  soft-tissue]
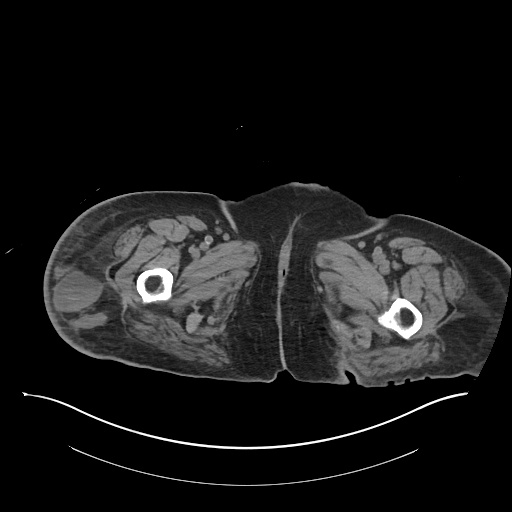
[im 4/103  bone]
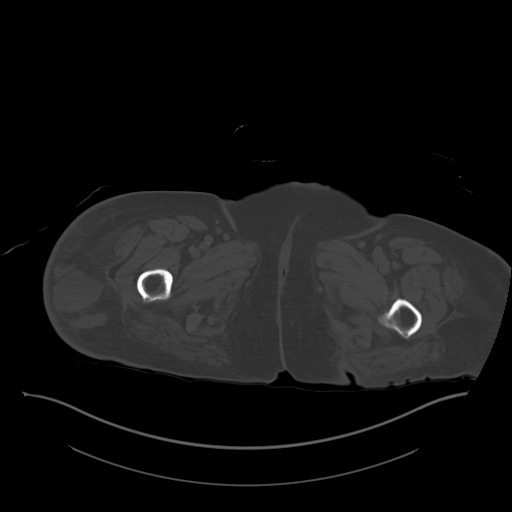
[im 12/103  soft-tissue]
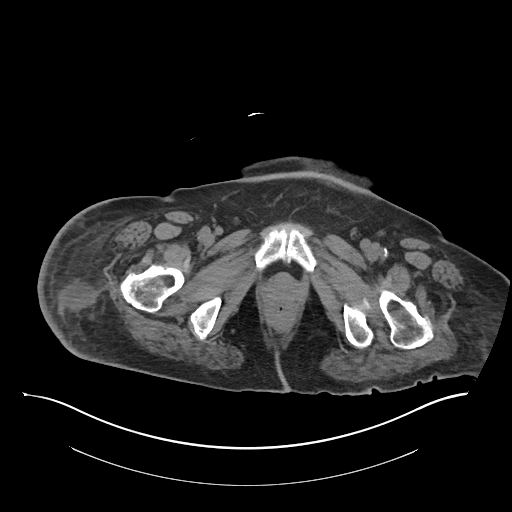
[im 20/103  soft-tissue]
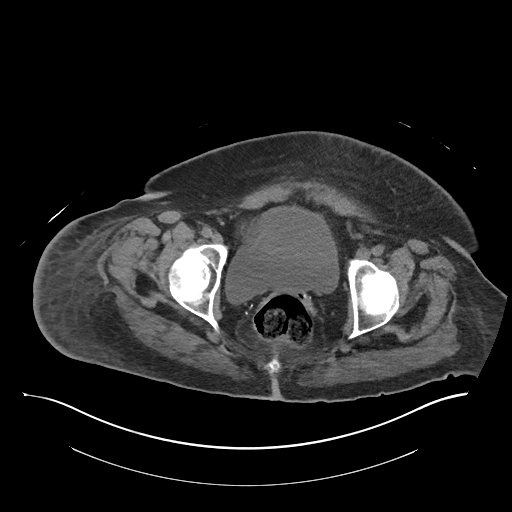
[im 28/103  soft-tissue]
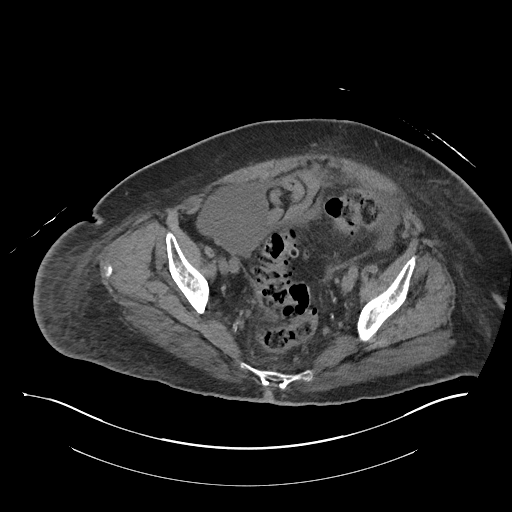
[im 36/103  soft-tissue]
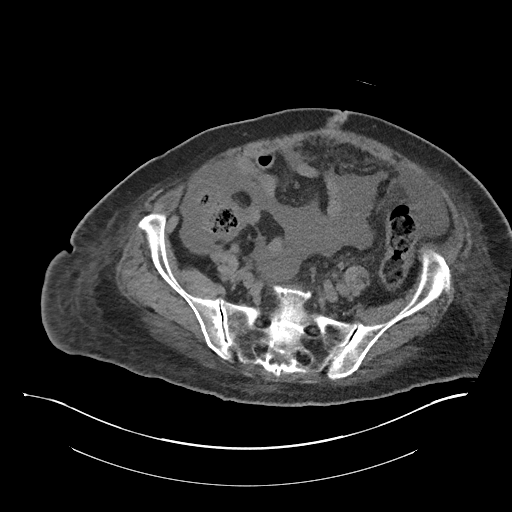
[im 44/103  soft-tissue]
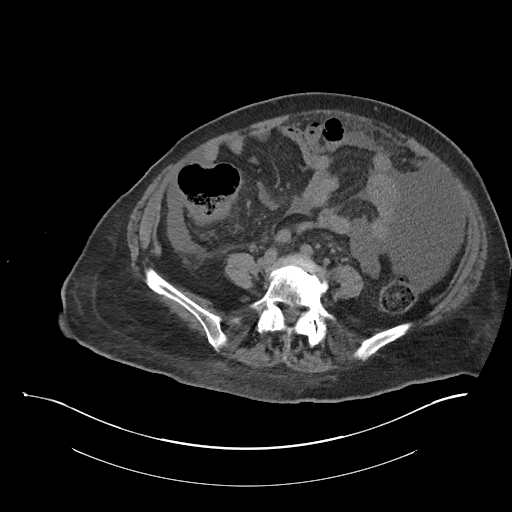
[im 52/103  soft-tissue]
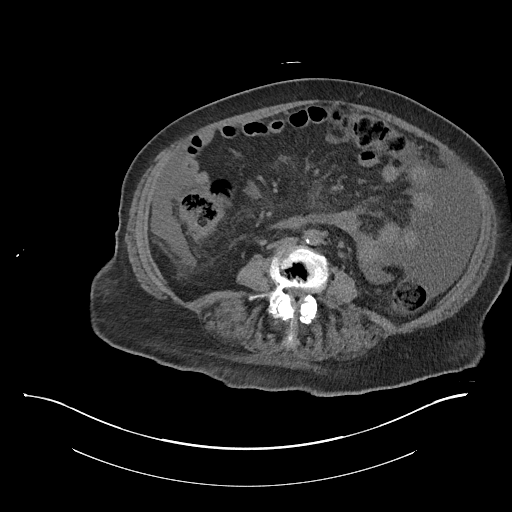
[im 59/103  soft-tissue]
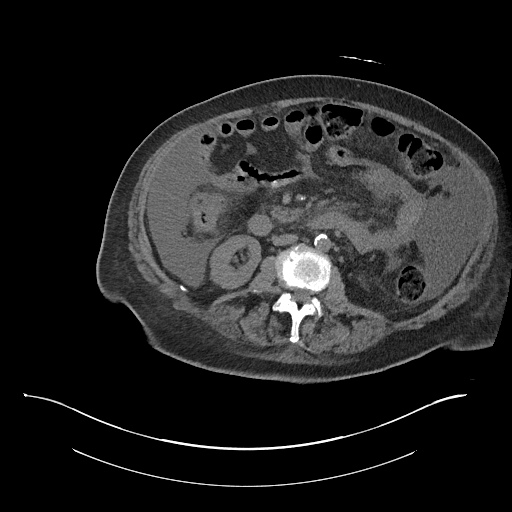
[im 67/103  soft-tissue]
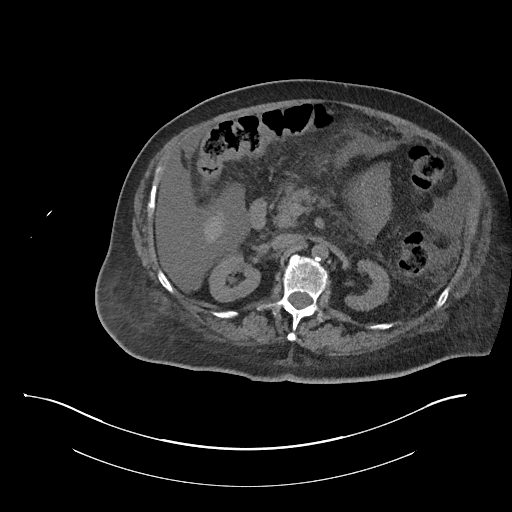
[im 67/103  bone]
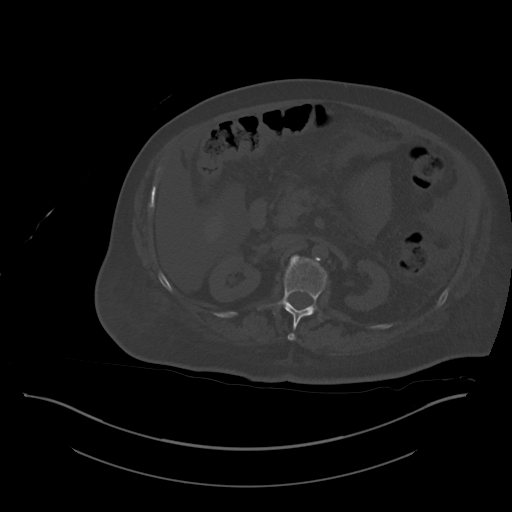
[im 75/103  soft-tissue]
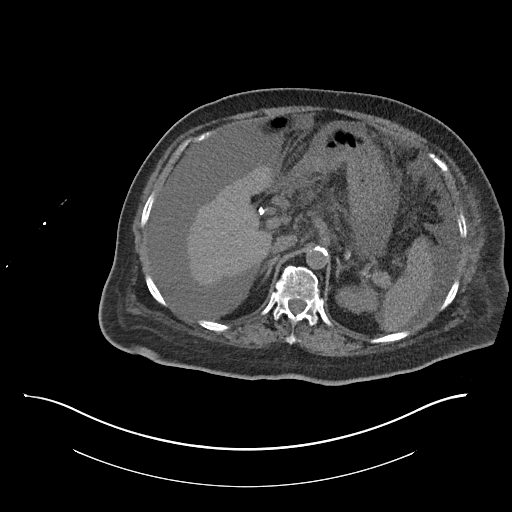
[im 83/103  soft-tissue]
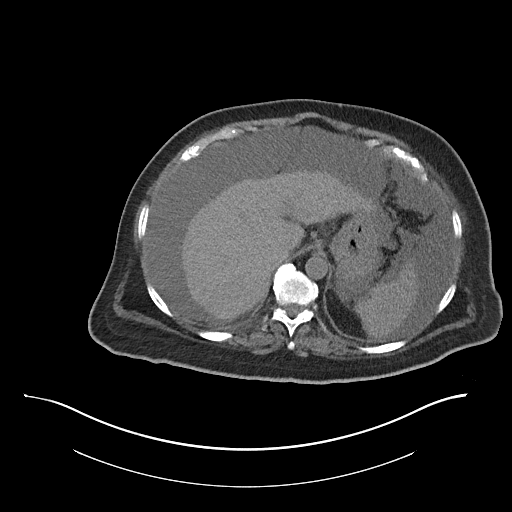
[im 91/103  soft-tissue]
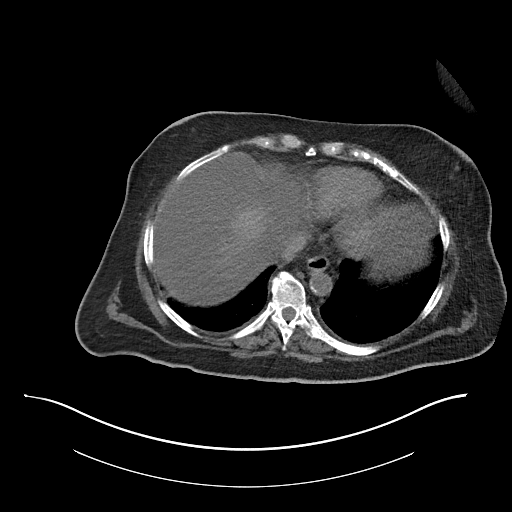
[im 99/103  soft-tissue]
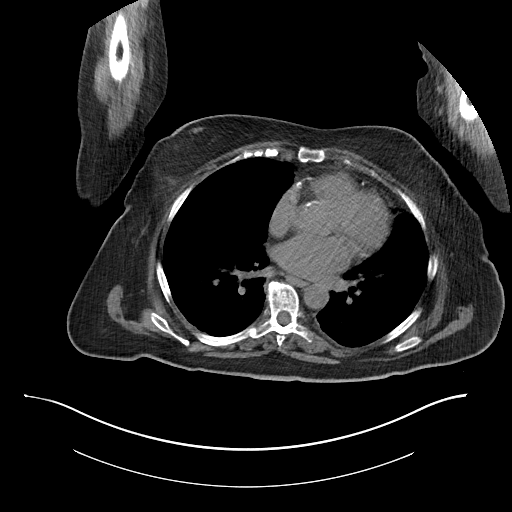

[Series 6: cor st · coronal · 0.99mm/px · 3 of 111 slices shown]
[im 37/111  soft-tissue]
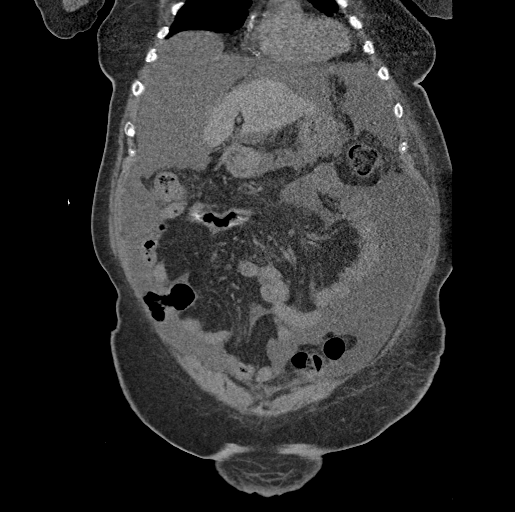
[im 49/111  soft-tissue]
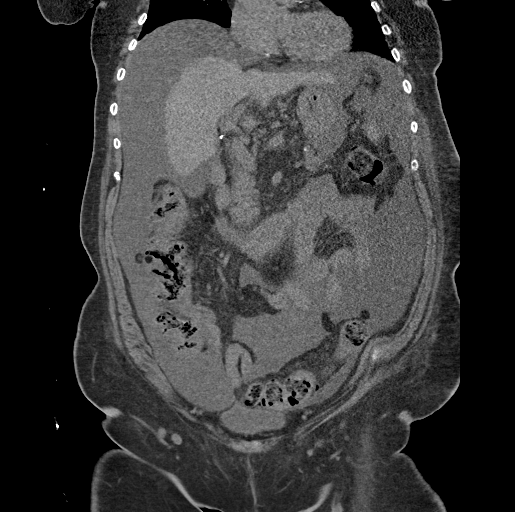
[im 62/111  soft-tissue]
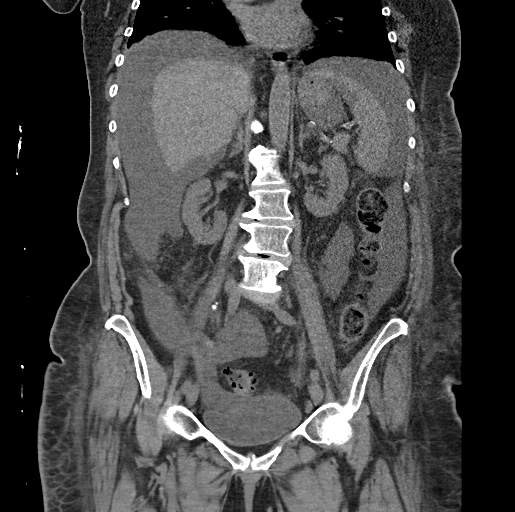

[16 of 46 positions shown; findings below may reference images not displayed]

FINDINGS: Lower chest: Motion artifacts limit evaluation of lower lung fields.
Coronary artery calcifications are seen.

Hepatobiliary: Liver appears smaller than usual in size. There is
nodularity in the liver surface. There is no dilation of bile ducts.
Surgical clips are seen in gallbladder fossa.

Pancreas: No focal abnormality is seen.

Spleen: Unremarkable.

Adrenals/Urinary Tract: Adrenals are unremarkable. There is no
hydronephrosis. Small calcifications are seen in the renal artery
branches in the left kidney. Ureters not dilated. Urinary bladder is
unremarkable.

Stomach/Bowel: Stomach is not distended. Small bowel loops are not
dilated. Appendix is not dilated. There is no significant wall
thickening in colon. Are multiple diverticula in colon without signs
of focal diverticulitis.

Vascular/Lymphatic: There are scattered arterial calcifications.

Reproductive: Uterus is not seen.

Other: There is large ascites. There is no pneumoperitoneum. There
is 6.2 x 5.3 x 4.9 cm smooth marginated lesion with mixed
attenuation in the subcutaneous plane in the upper lateral right
thigh at the level of greater trochanter and proximal shaft of right
femur. There is subcutaneous stranding.

Musculoskeletal: Degenerative changes are noted in the lumbar spine,
more so at L3-L4 and L4-L5 levels. There is first-degree
spondylolisthesis and marked spinal stenosis at L4-L5 level. There
is encroachment of neural foramina at multiple levels.
IMPRESSION: Cirrhosis of liver.  Large ascites.

There is no evidence intestinal obstruction or pneumoperitoneum.
There is no hydronephrosis.

Multiple diverticula are seen in colon without signs of focal
diverticulitis. Coronary artery calcifications are seen. Lumbar
spondylosis, particularly severe at L4-L5 level.

There is loculated mixed density lesion measuring 6.2 cm in maximum
diameter in the subcutaneous plane lateral to the right hip. This
may suggest resolving hematoma or some of the inflammatory or
neoplastic process. Please correlate with clinical physical
examination findings and consider sonogram for further
characterization.

## 2023-11-20 IMAGING — US IR PARACENTESIS
1 series · 4 of 4 positions shown · non-contrast
Comparison: none

INDICATION: Patient with a history of cirrhosis presents today with ascites.
Interventional radiology asked to perform a diagnostic and
therapeutic paracentesis.

[Series 1: ir (person_name)/(person_name) · 4 of 4 slices shown]
[im 1/4]
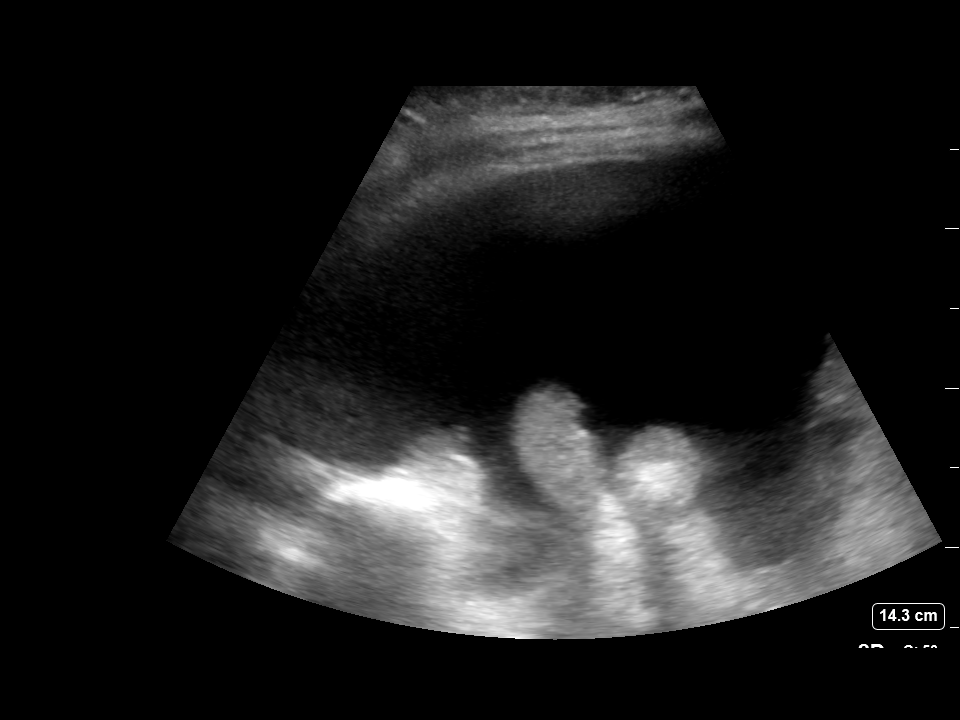
[im 2/4]
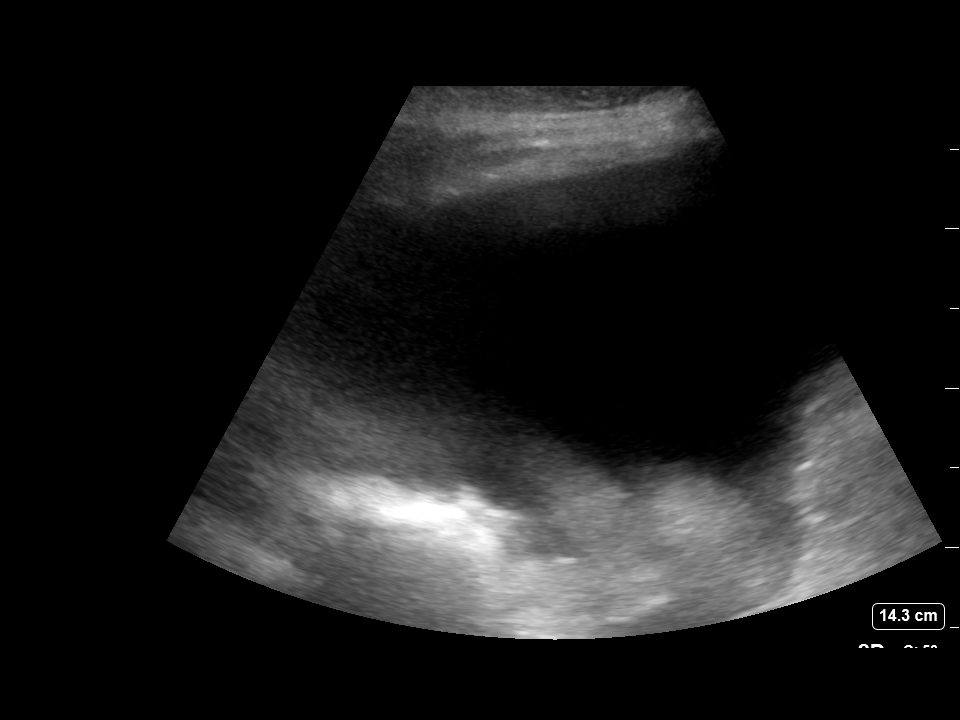
[im 3/4]
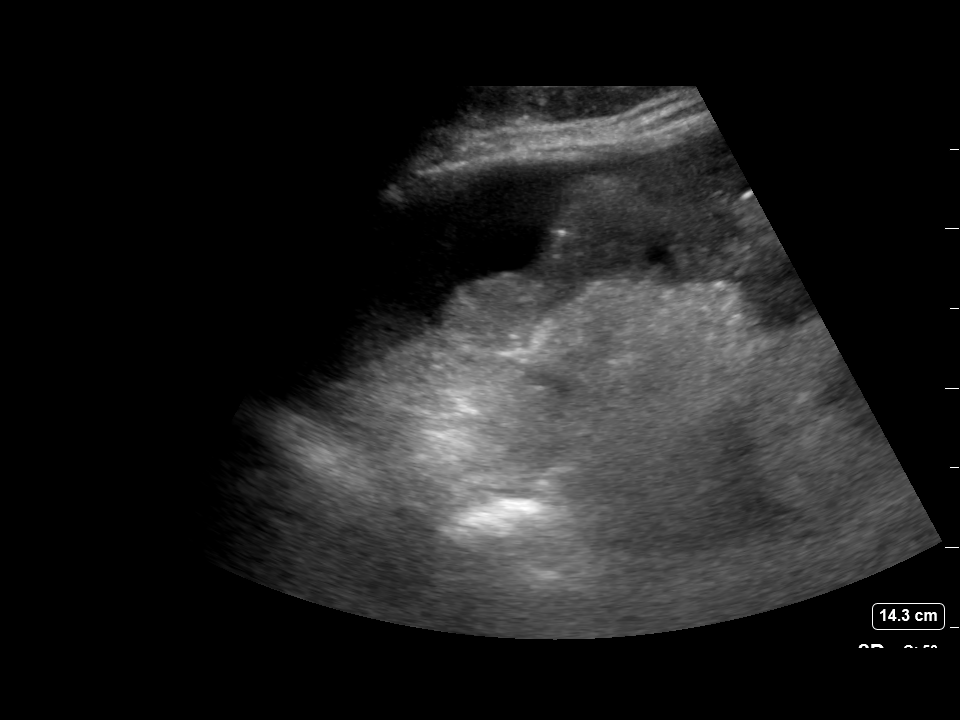
[im 4/4]
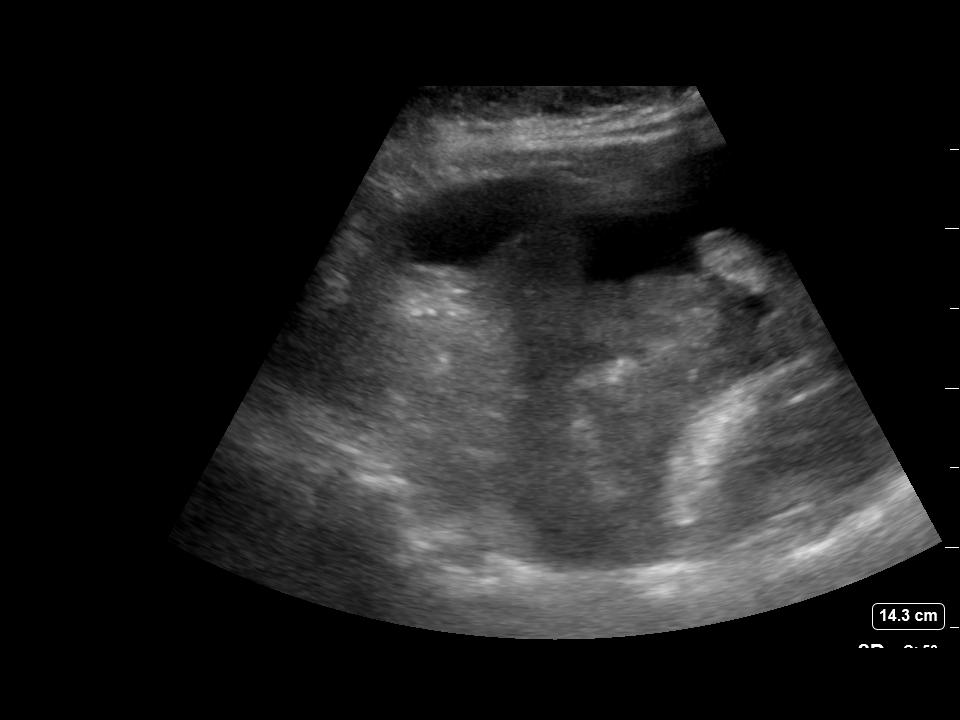

[4 of 4 positions shown; findings below may reference images not displayed]

EXAM:
ULTRASOUND GUIDED PARACENTESIS

MEDICATIONS:
1% lidocaine 10 mL

COMPLICATIONS:
None immediate.

PROCEDURE:
Informed written consent was obtained from the patient after a
discussion of the risks, benefits and alternatives to treatment. A
timeout was performed prior to the initiation of the procedure.

Initial ultrasound scanning demonstrates a large amount of ascites
within the left lower abdominal quadrant. The left lower abdomen was
prepped and draped in the usual sterile fashion. 1% lidocaine was
used for local anesthesia.

Following this, a 19 gauge, 7-cm, Yueh catheter was introduced. An
ultrasound image was saved for documentation purposes. The
paracentesis was performed. The catheter was removed and a dressing
was applied. The patient tolerated the procedure well without
immediate post procedural complication.
FINDINGS: A total of approximately 4 L of clear yellow fluid was removed.
Samples were sent to the laboratory as requested by the clinical
team.
IMPRESSION: Successful ultrasound-guided paracentesis yielding 4 liters of
peritoneal fluid. Read by: Jullon Lienad, NP

PLAN:
If the patient eventually requires >/=2 paracenteses in a 30 day
period, candidacy for formal evaluation by the [HOSPITAL]
assessed.
